# Patient Record
Sex: Female | Born: 1935 | Race: White | Hispanic: No | Smoking: Former smoker
Health system: Southern US, Community
[De-identification: ages and names within clinical notes are randomized; demographics above are authoritative.]

## PROBLEM LIST (undated history)

## (undated) DIAGNOSIS — I10 Essential (primary) hypertension: Secondary | ICD-10-CM

## (undated) DIAGNOSIS — M1990 Unspecified osteoarthritis, unspecified site: Secondary | ICD-10-CM

## (undated) DIAGNOSIS — G4730 Sleep apnea, unspecified: Secondary | ICD-10-CM

## (undated) DIAGNOSIS — K219 Gastro-esophageal reflux disease without esophagitis: Secondary | ICD-10-CM

## (undated) DIAGNOSIS — E039 Hypothyroidism, unspecified: Secondary | ICD-10-CM

## (undated) DIAGNOSIS — F419 Anxiety disorder, unspecified: Secondary | ICD-10-CM

## (undated) HISTORY — DX: Sleep apnea, unspecified: G47.30

## (undated) HISTORY — DX: Gastro-esophageal reflux disease without esophagitis: K21.9

## (undated) HISTORY — DX: Essential (primary) hypertension: I10

## (undated) HISTORY — DX: Hypothyroidism, unspecified: E03.9

## (undated) HISTORY — DX: Anxiety disorder, unspecified: F41.9

## (undated) HISTORY — DX: Unspecified osteoarthritis, unspecified site: M19.90

---

## 2006-11-04 ENCOUNTER — Encounter: Admission: RE | Admit: 2006-11-04 | Discharge: 2006-11-04 | Payer: Self-pay | Admitting: Orthopedic Surgery

## 2006-11-21 ENCOUNTER — Encounter: Admission: RE | Admit: 2006-11-21 | Discharge: 2006-11-21 | Payer: Self-pay | Admitting: Orthopedic Surgery

## 2006-12-21 ENCOUNTER — Encounter: Admission: RE | Admit: 2006-12-21 | Discharge: 2006-12-21 | Payer: Self-pay | Admitting: Orthopedic Surgery

## 2007-01-16 ENCOUNTER — Encounter: Admission: RE | Admit: 2007-01-16 | Discharge: 2007-01-16 | Payer: Self-pay | Admitting: Specialist

## 2008-05-19 ENCOUNTER — Encounter: Admission: RE | Admit: 2008-05-19 | Discharge: 2008-05-19 | Payer: Self-pay | Admitting: Specialist

## 2008-07-15 ENCOUNTER — Inpatient Hospital Stay (HOSPITAL_COMMUNITY): Admission: RE | Admit: 2008-07-15 | Discharge: 2008-07-21 | Payer: Self-pay | Admitting: Specialist

## 2008-09-18 ENCOUNTER — Encounter: Payer: Self-pay | Admitting: Pulmonary Disease

## 2008-12-30 ENCOUNTER — Inpatient Hospital Stay (HOSPITAL_COMMUNITY): Admission: RE | Admit: 2008-12-30 | Discharge: 2009-01-04 | Payer: Self-pay | Admitting: Orthopedic Surgery

## 2009-03-12 ENCOUNTER — Encounter: Payer: Self-pay | Admitting: Pulmonary Disease

## 2009-04-07 ENCOUNTER — Encounter: Payer: Self-pay | Admitting: Pulmonary Disease

## 2009-04-15 ENCOUNTER — Encounter: Payer: Self-pay | Admitting: Pulmonary Disease

## 2009-05-06 DIAGNOSIS — E1149 Type 2 diabetes mellitus with other diabetic neurological complication: Secondary | ICD-10-CM

## 2009-05-06 DIAGNOSIS — M722 Plantar fascial fibromatosis: Secondary | ICD-10-CM

## 2009-05-06 DIAGNOSIS — I10 Essential (primary) hypertension: Secondary | ICD-10-CM | POA: Insufficient documentation

## 2009-05-06 DIAGNOSIS — E559 Vitamin D deficiency, unspecified: Secondary | ICD-10-CM | POA: Insufficient documentation

## 2009-05-06 DIAGNOSIS — M5430 Sciatica, unspecified side: Secondary | ICD-10-CM

## 2009-05-06 DIAGNOSIS — M7710 Lateral epicondylitis, unspecified elbow: Secondary | ICD-10-CM | POA: Insufficient documentation

## 2009-05-06 DIAGNOSIS — E039 Hypothyroidism, unspecified: Secondary | ICD-10-CM | POA: Insufficient documentation

## 2009-05-06 DIAGNOSIS — M1990 Unspecified osteoarthritis, unspecified site: Secondary | ICD-10-CM | POA: Insufficient documentation

## 2009-05-06 DIAGNOSIS — K219 Gastro-esophageal reflux disease without esophagitis: Secondary | ICD-10-CM

## 2009-05-06 DIAGNOSIS — E7800 Pure hypercholesterolemia, unspecified: Secondary | ICD-10-CM | POA: Insufficient documentation

## 2009-05-06 DIAGNOSIS — F509 Eating disorder, unspecified: Secondary | ICD-10-CM | POA: Insufficient documentation

## 2009-05-06 DIAGNOSIS — F17200 Nicotine dependence, unspecified, uncomplicated: Secondary | ICD-10-CM | POA: Insufficient documentation

## 2009-05-06 DIAGNOSIS — R32 Unspecified urinary incontinence: Secondary | ICD-10-CM | POA: Insufficient documentation

## 2009-05-07 ENCOUNTER — Ambulatory Visit: Payer: Self-pay | Admitting: Pulmonary Disease

## 2009-05-07 DIAGNOSIS — G4736 Sleep related hypoventilation in conditions classified elsewhere: Secondary | ICD-10-CM

## 2009-05-27 ENCOUNTER — Encounter: Payer: Self-pay | Admitting: Pulmonary Disease

## 2009-05-27 ENCOUNTER — Ambulatory Visit (HOSPITAL_BASED_OUTPATIENT_CLINIC_OR_DEPARTMENT_OTHER): Admission: RE | Admit: 2009-05-27 | Discharge: 2009-05-27 | Payer: Self-pay | Admitting: Pulmonary Disease

## 2009-06-12 ENCOUNTER — Encounter: Admission: RE | Admit: 2009-06-12 | Discharge: 2009-06-12 | Payer: Self-pay | Admitting: Specialist

## 2009-06-16 ENCOUNTER — Ambulatory Visit: Payer: Self-pay | Admitting: Pulmonary Disease

## 2009-06-17 ENCOUNTER — Telehealth (INDEPENDENT_AMBULATORY_CARE_PROVIDER_SITE_OTHER): Payer: Self-pay | Admitting: *Deleted

## 2009-06-19 ENCOUNTER — Ambulatory Visit: Payer: Self-pay | Admitting: Pulmonary Disease

## 2009-06-19 DIAGNOSIS — G4733 Obstructive sleep apnea (adult) (pediatric): Secondary | ICD-10-CM | POA: Insufficient documentation

## 2009-07-27 ENCOUNTER — Telehealth: Payer: Self-pay | Admitting: Pulmonary Disease

## 2009-07-27 ENCOUNTER — Encounter: Payer: Self-pay | Admitting: Pulmonary Disease

## 2009-07-31 ENCOUNTER — Ambulatory Visit: Payer: Self-pay | Admitting: Pulmonary Disease

## 2009-09-10 ENCOUNTER — Encounter: Payer: Self-pay | Admitting: Pulmonary Disease

## 2009-10-16 ENCOUNTER — Inpatient Hospital Stay (HOSPITAL_COMMUNITY): Admission: RE | Admit: 2009-10-16 | Discharge: 2009-10-21 | Payer: Self-pay | Admitting: Orthopedic Surgery

## 2009-10-17 ENCOUNTER — Ambulatory Visit: Payer: Self-pay | Admitting: Infectious Diseases

## 2009-11-24 ENCOUNTER — Encounter: Payer: Self-pay | Admitting: Pulmonary Disease

## 2009-11-25 ENCOUNTER — Encounter: Payer: Self-pay | Admitting: Infectious Diseases

## 2009-11-26 ENCOUNTER — Ambulatory Visit: Payer: Self-pay | Admitting: Infectious Diseases

## 2009-11-26 DIAGNOSIS — L270 Generalized skin eruption due to drugs and medicaments taken internally: Secondary | ICD-10-CM | POA: Insufficient documentation

## 2009-11-26 DIAGNOSIS — T8489XA Other specified complication of internal orthopedic prosthetic devices, implants and grafts, initial encounter: Secondary | ICD-10-CM

## 2009-11-30 ENCOUNTER — Encounter: Payer: Self-pay | Admitting: Infectious Diseases

## 2009-12-01 ENCOUNTER — Encounter: Payer: Self-pay | Admitting: Infectious Diseases

## 2009-12-07 ENCOUNTER — Telehealth: Payer: Self-pay | Admitting: Pulmonary Disease

## 2009-12-22 ENCOUNTER — Encounter (INDEPENDENT_AMBULATORY_CARE_PROVIDER_SITE_OTHER): Payer: Self-pay | Admitting: Orthopedic Surgery

## 2009-12-22 ENCOUNTER — Inpatient Hospital Stay (HOSPITAL_COMMUNITY): Admission: RE | Admit: 2009-12-22 | Discharge: 2009-12-25 | Payer: Self-pay | Admitting: Orthopedic Surgery

## 2010-09-21 NOTE — Letter (Signed)
Summary: CMN for CPAP Supplies/[facility]  CMN for CPAP Supplies/[facility]   Imported By: Sherian Rein 09/17/2009 09:08:17  _____________________________________________________________________  External Attachment:    Type:   Image     Comment:   External Document  Appended Document: Orders Update    Clinical Lists Changes  Orders: Added new Referral order of DME Referral (DME) - Signed

## 2010-09-21 NOTE — Letter (Signed)
Summary: External Correspondence  External Correspondence   Imported By: Valinda Hoar 11/24/2009 08:34:21  _____________________________________________________________________  External Attachment:    Type:   Image     Comment:   External Document

## 2010-09-21 NOTE — Progress Notes (Signed)
Summary: pt turned cpap back to dme  Phone Note Outgoing Call   Call placed by: Alfonso Ramus Call placed to: Patient Summary of Call: [phone] Called pt to advise that CPAP Pressure would need to be increased to pressure of 12 cm per download.  Had left several messages and pt hadn't returned my call. Pt stated that she had turned cpap back in to [facility].  Pt stated that she couldn't wear cpap. Called [facility] and spoke with Tresa Endo who stated that pt did turn her cpap back in on 10/20/09. Appt for 01/28/10 cancelled per pt request. Initial call taken by: Alfonso Ramus,  December 07, 2009 3:35 PM  Follow-up for Phone Call        please call pt and remind her that she has severe sleep apnea with a BIG drop in her blood oxygen levels.  This is a big health risk for her.  She really needs ov to discuss this and other possible treatment options. Follow-up by: Barbaraann Share MD,  December 09, 2009 7:19 PM  Additional Follow-up for Phone Call Additional follow up Details #1::        Pt states she does not want to use CPAP at this time, and wants our office to leave her alone. She states she cannot sleep with the CPAP so that is why she turned it in. I advised there were many options as far as masks and things, she states she just wants to leave it alone for now and wants [place] to do the same. She refuses appt. Carron Curie CMA  December 10, 2009 9:07 AM     Additional Follow-up for Phone Call Additional follow up Details #2::    called pt and informed her of her critical oxygen desaturation at night, that potentially could be life threatening.  I have asked her to consider oxygen therapy at night since she doesn't want to wear cpap.  she states "I don't have time for this right now".  I have asked her to call us if she decides to try oxygen. Follow-up by: Barbaraann Share MD,  December 14, 2009 5:31 PM

## 2010-09-21 NOTE — Miscellaneous (Signed)
Summary: HIPAA Restrictions  HIPAA Restrictions   Imported By: Florinda Marker 11/30/2009 15:04:49  _____________________________________________________________________  External Attachment:    Type:   Image     Comment:   External Document

## 2010-09-21 NOTE — Assessment & Plan Note (Signed)
Summary: [facility] NEED CHART PROS KNEE INF/KAM   Referring Provider:  Arlan Organ Primary Provider:  Arlan Organ  CC:  [facility].  History of Present Illness:  This is a very pleasant 76 year old white   female with a history of morbid obesity, hypertension, diabetes,   hypothyroidism and asthma who underwent right total knee arthroplasty by   Dr. August Saucer in May of 2010 for severe end-stage osteoarthritis.  She says   the incision healed up well and she did great until the last few weeks,   when she started having some mild chills.  She did not have knee pain   until about 1 week ago, when she developed severe chills, diffuse   weakness, and developed severe knee pain where she could hardly walk.   She presented to Dr. Diamantina Providence office and had knee aspiration done on   October 14, 2009.  Cx grew coag-negative staph.   She  was admitted and had a washout and actually right knee extraction and   placement of antibiotic spacer with vancomycin and tobramycin on   October 16, 2009.  She is now postoperative and we are consulted for   further antibiotic management.   esr 109 crp 13 d/ced march 2nd on vanco.   Has been at [facility] and doing ok except broke out with whoel body rash 3 days ago.   Saw Dr August Saucer a few days ago.     Preventive Screening-Counseling & Management  Alcohol-Tobacco     Alcohol drinks/day: 0     Smoking Status: quit  Caffeine-Diet-Exercise     Caffeine use/day: coffee     Type of exercise: Rehab     Exercise (avg: min/session): 30-60     Times/week: 5  Safety-Violence-Falls     Seat Belt Use: yes   Prior Medication List:  SYNTHROID 125 MCG TABS (LEVOTHYROXINE SODIUM) Take 1 tablet by mouth once a day TOPROL XL 50 MG XR24H-TAB (METOPROLOL SUCCINATE) 1/2 tab by mouth two times a day LASIX 20 MG TABS (FUROSEMIDE) Take 1 tablet by mouth once a day GLUCOPHAGE XR 500 MG XR24H-TAB (METFORMIN HCL) take 2 tabs by mouth two times a day AMARYL 2 MG TABS (GLIMEPIRIDE) Take 1  tablet by mouth once a day LANTUS 100 UNIT/ML SOLN (INSULIN GLARGINE) take as directed VASOTEC 10 MG TABS (ENALAPRIL MALEATE) Take 1 tablet by mouth once a day FISH OIL 1000 MG CAPS (OMEGA-3 FATTY ACIDS) Take 1 tablet by mouth two times a day ZOCOR 80 MG TABS (SIMVASTATIN) Take 1 tablet by mouth once a day CITRACAL/VITAMIN D 250-200 MG-UNIT TABS (CALCIUM CITRATE-VITAMIN D) Take 1 tablet by mouth two times a day LYRICA 100 MG CAPS (PREGABALIN) Take 1 tablet by mouth three times a day * POTASSIUM Take 1 tablet by mouth two times a day VITAMIN D3 1000 UNIT TABS (CHOLECALCIFEROL) Take 1 tablet by mouth two times a day   Current Allergies (reviewed today): ! * GABAPENTIN Past History:  Past Surgical History: Last updated: 05/06/2009 carpal tunnel release 2003 lumbar surgery 07-15-2008 R total knee 12-30-2008  Family History: Last updated: 2009/06/04 father:  Deceased at age 6.  MI, DM, HTN mother: Deceased at age 31.  HTN, CAD, DM Siblings: three.  sister had aneurysm in neck.  asthma children: four.  Leukemia, DM,  son-suicide   emphysema: father allergies: brother heart disease: mother, father rheumatism: mother   Social History: Last updated: 11/26/2009 Patient states former smoker.  started at age 31.  2 1/2 to 51  ppd.  quit 1985 pt is widowed. pt has children. pt is a retired Surveyor, mining. The patient lives alone in [place].  Her son does live   right behind her and she has family involved in her care.  Her sister   was helping to care for her at home due to her immobility issues but her   sister has been sick herself.  The patient is trying to apply for home   health aides at home.   Risk Factors: Alcohol Use: 0 (11/26/2009) Caffeine Use: coffee (11/26/2009)  Risk Factors: Smoking Status: quit (11/26/2009)  Past Medical History: Current Problems:  SCIATICA (ICD-724.3) FASCIITIS, PLANTAR (ICD-728.71) LATERAL EPICONDYLITIS (ICD-726.32) TOBACCO ABUSE  (ICD-305.1) URINARY INCONTINENCE (ICD-788.30) BODY MASS INDEX 40 AND OVER ADULT (ICD-V85.4) EATING DISORDER (ICD-307.50) GERD (ICD-530.81) OSTEOARTHRITIS (ICD-715.90) HYPERCHOLESTEROLEMIA (ICD-272.0) HYPERTENSION (ICD-401.9) VITAMIN D DEFICIENCY (ICD-268.9) DIABETES MELLITUS, TYPE II, WITH NEUROLOGICAL COMPLICATIONS (ICD-250.60) HYPOTHYROIDISM (ICD-244.9)  1. Total knee arthroplasty as above.   2. Prior lumbar surgery.   3. Hypertension.   4. Hypothyroidism.   5. Diabetes.   6. Morbid obesity.   7. Asthma.     Social History: Patient states former smoker.  started at age 11.  2 1/2 to 3 ppd.  quit 1985 pt is widowed. pt has children. pt is a retired Surveyor, mining. The patient lives alone in [place].  Her son does live   right behind her and she has family involved in her care.  Her sister   was helping to care for her at home due to her immobility issues but her   sister has been sick herself.  The patient is trying to apply for home   health aides at home.   Review of Systems       11 systems reviewed and negative except per HPI   Vital Signs:  Patient profile:   76 year old female Height:      66 inches (167.64 cm) Weight:      269.0 pounds (122.27 kg) BMI:     43.57 Temp:     98.1 degrees F (36.72 degrees C) oral Pulse rate:   109 / minute BP sitting:   141 / 79  (left arm)  Vitals Entered By: Baxter Hire) (November 26, 2009 10:49 AM) CC: [facility] Is Patient Diabetic? Yes Pain Assessment Patient in pain? no      Nutritional Status BMI of > 30 = obese Nutritional Status Detail APPETITE IS GOOD PER PATIENT  Does patient need assistance? Functional Status Self care Ambulation Normal Comments patient uses a walker as well as a wheelchair   Physical Exam  General:  alert, well-developed, and well-nourished.   Head:  normocephalic.   Mouth:  fair dentition.   Neck:  supple.   Lungs:  normal respiratory effort and no accessory muscle use.     Heart:  normal rate and regular rhythm.   Msk:  R knee incison well healed.  Some edema and induration around knee  Extremities:  1+ edema bil Neurologic:  alert & oriented X3 and cranial nerves II-XII intact.   Skin:  picc rue diffuse macular rash  Cervical Nodes:  no anterior cervical adenopathy and no posterior cervical adenopathy.   Psych:  Oriented X3 and memory intact for recent and remote.   Additional Exam:  BW 4/6 wbc 5 hgb 11 plt 172 eos .3 esr 26 crp 9.6 high sensitiviey    FEW  STAPHYLOCOCCUS SPECIES(COAGULASE NEGATIVE)  METHOD:                       MIC  CLINDAMYCIN:                  <=0.25                                SENSITIVE  ERYTHROMYCIN:                 <=0.25                                SENSITIVE  GENTAMICIN:                   <=0.5                                SENSITIVE  LEVOFLOXACIN:                 <=0.12                                SENSITIVE  OXACILLIN:                    1                                RESISTANT  PENICILLIN:                   >=0.5                                RESISTANT  RIFAMPIN:                     <=0.5                                SENSITIVE  VANCOMYCIN:                   <=0.5                                SENSITIVE  TETRACYCLINE:                 <=1                                SENSITIVE   Impression & Recommendations:  Problem # 1:  OTH COMPLICATIONS DUE INTERNAL JOINT PROSTHESIS (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  Develoepd rash a few days ago. Knee well healed.  Will stop vanco, pull picc. Start doxy 100 by mouth two times a day for 2 more weeks and I have left message with Dr August Saucer re whether or not he will reimplant and wants her off abx prior to surgery  Orders: Est. Patient Level IV (16109)  Problem # 2:  DERMATITIS DUE DRUGS&MEDICINES  TAKEN INTERNALLY (ICD-693.0)  stop vanco. BW from yest with nml wbc and no eosinophils.   Zyrtec for itching  Orders: Est. Patient Level IV (16109)  Patient Instructions: 1)  Follow up in 3 weeks.   2)  Stop vancomycin and pull picc line. 3)  Start doxycycline 100 mg by mouth two times a day until seen by Dr August Saucer.  Prevention & Chronic Care Immunizations   Influenza vaccine: Fluvax 3+  (06/24/2008)    Tetanus booster: 03/18/2008: Tdap    Pneumococcal vaccine: Pneumovax  (06/23/2007)    H. zoster vaccine: Not documented  Colorectal Screening   Hemoccult: Not documented    Colonoscopy: Not documented  Other Screening   Pap smear: Not documented    Mammogram: Not documented    DXA bone density scan: Not documented   Smoking status: quit  (11/26/2009)  Diabetes Mellitus   HgbA1C: Not documented    Eye exam: Not documented    Foot exam: Not documented   High risk foot: Not documented   Foot care education: Not documented    Urine microalbumin/creatinine ratio: Not documented  Lipids   Total Cholesterol: Not documented   LDL: Not documented   LDL Direct: Not documented   HDL: Not documented   Triglycerides: Not documented    SGOT (AST): Not documented   SGPT (ALT): Not documented   Alkaline phosphatase: Not documented   Total bilirubin: Not documented  Hypertension   Last Blood Pressure: 141 / 79  (11/26/2009)   Serum creatinine: Not documented   Serum potassium Not documented  Self-Management Support :    Diabetes self-management support: Not documented    Hypertension self-management support: Not documented    Lipid self-management support: Not documented     Appended Document: Spoke to Dr August Saucer    Phone Note Other Incoming   Summary of Call: Spoke to Dr August Saucer and he would prefer her to be off abx so he can assess in 2 weeks whether or not to replace new based on her numbers.  I have called [facility] and told them to d/c the doxy I just ordered.      Initial call taken by: Clydie Braun MD,  November 26, 2009 2:10 PM

## 2010-09-21 NOTE — Consult Note (Signed)
Summary: Consultation Report  Consultation Report   Imported By: Florinda Marker 12/03/2009 09:47:13  _____________________________________________________________________  External Attachment:    Type:   Image     Comment:   External Document

## 2010-09-21 NOTE — Miscellaneous (Signed)
Summary: [facility]: RX  [facility]: RX   Imported By: Florinda Marker 12/01/2009 16:11:13  _____________________________________________________________________  External Attachment:    Type:   Image     Comment:   External Document

## 2010-10-20 IMAGING — CR DG KNEE 1-2V PORT*R*
2 series · 2 of 2 positions shown · non-contrast
Comparison: 10/16/2009

CLINICAL DATA: Right knee replacement.  Infection.

PORTABLE RIGHT KNEE - 1-2 VIEW

[view not recorded (1 of 2)]
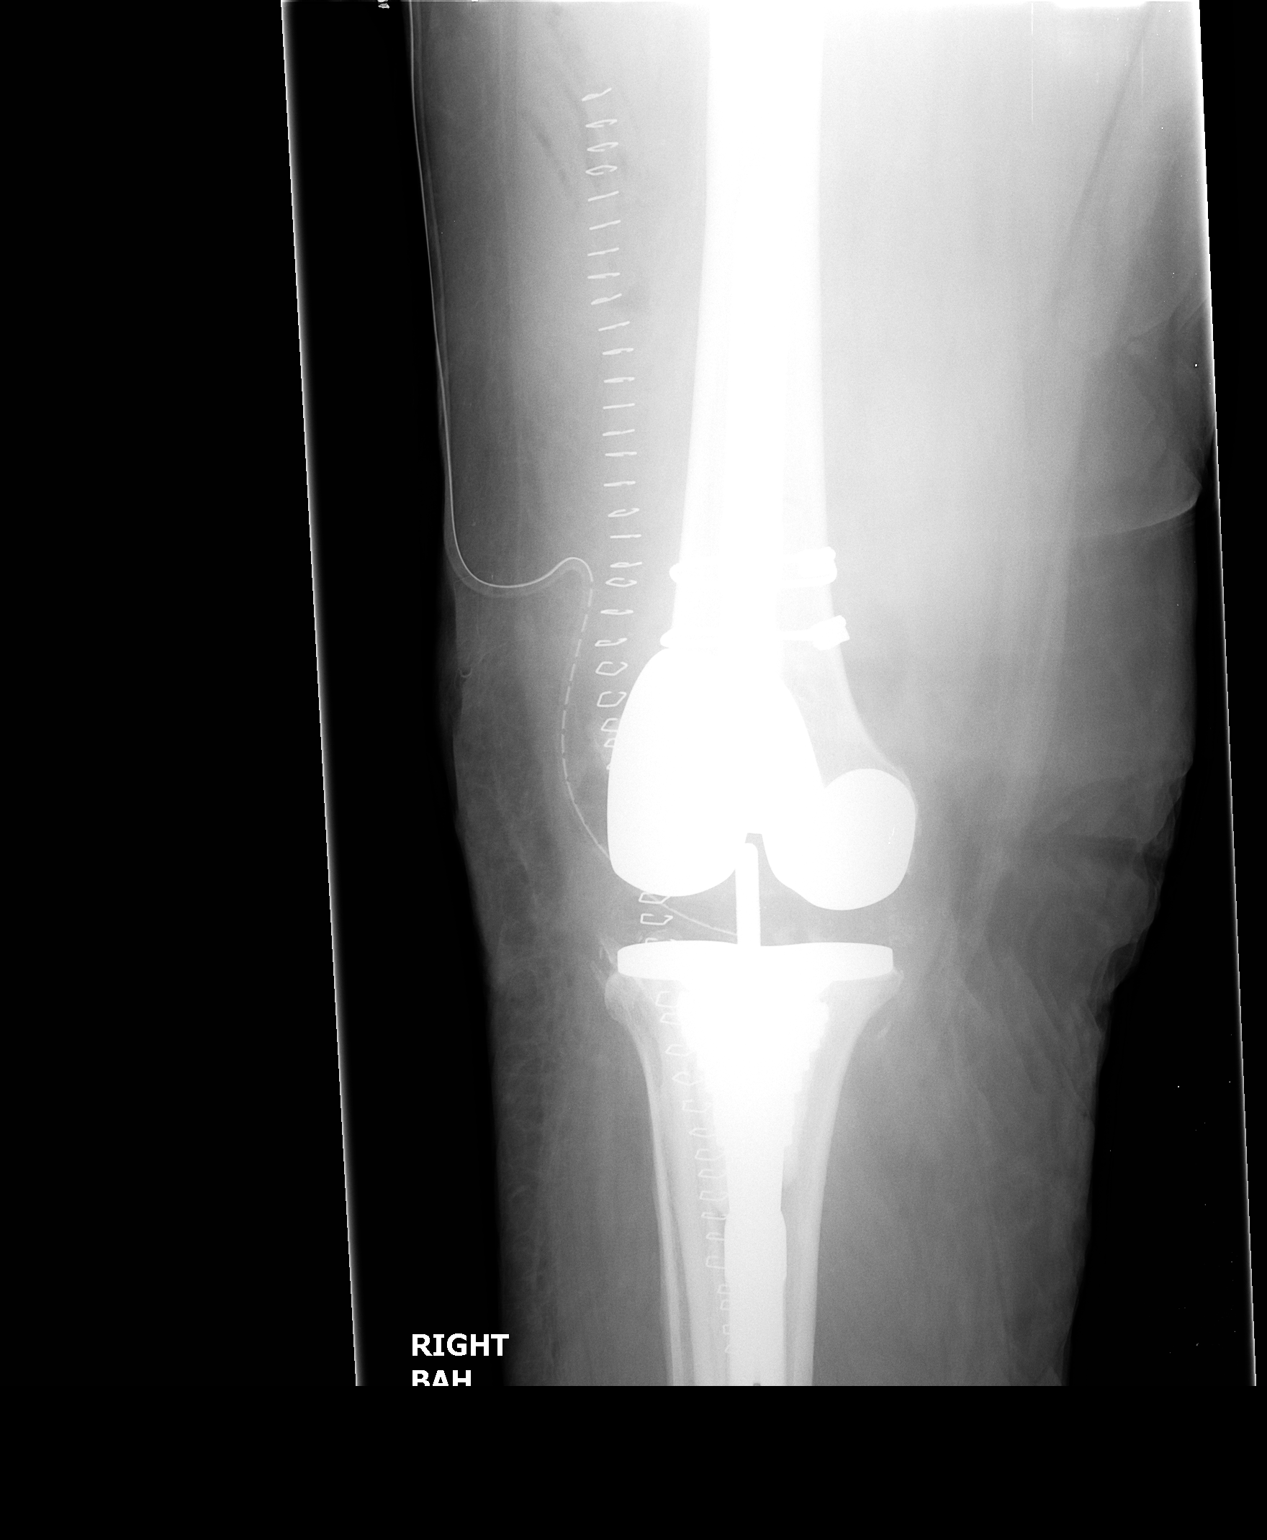

[view not recorded (2 of 2)]
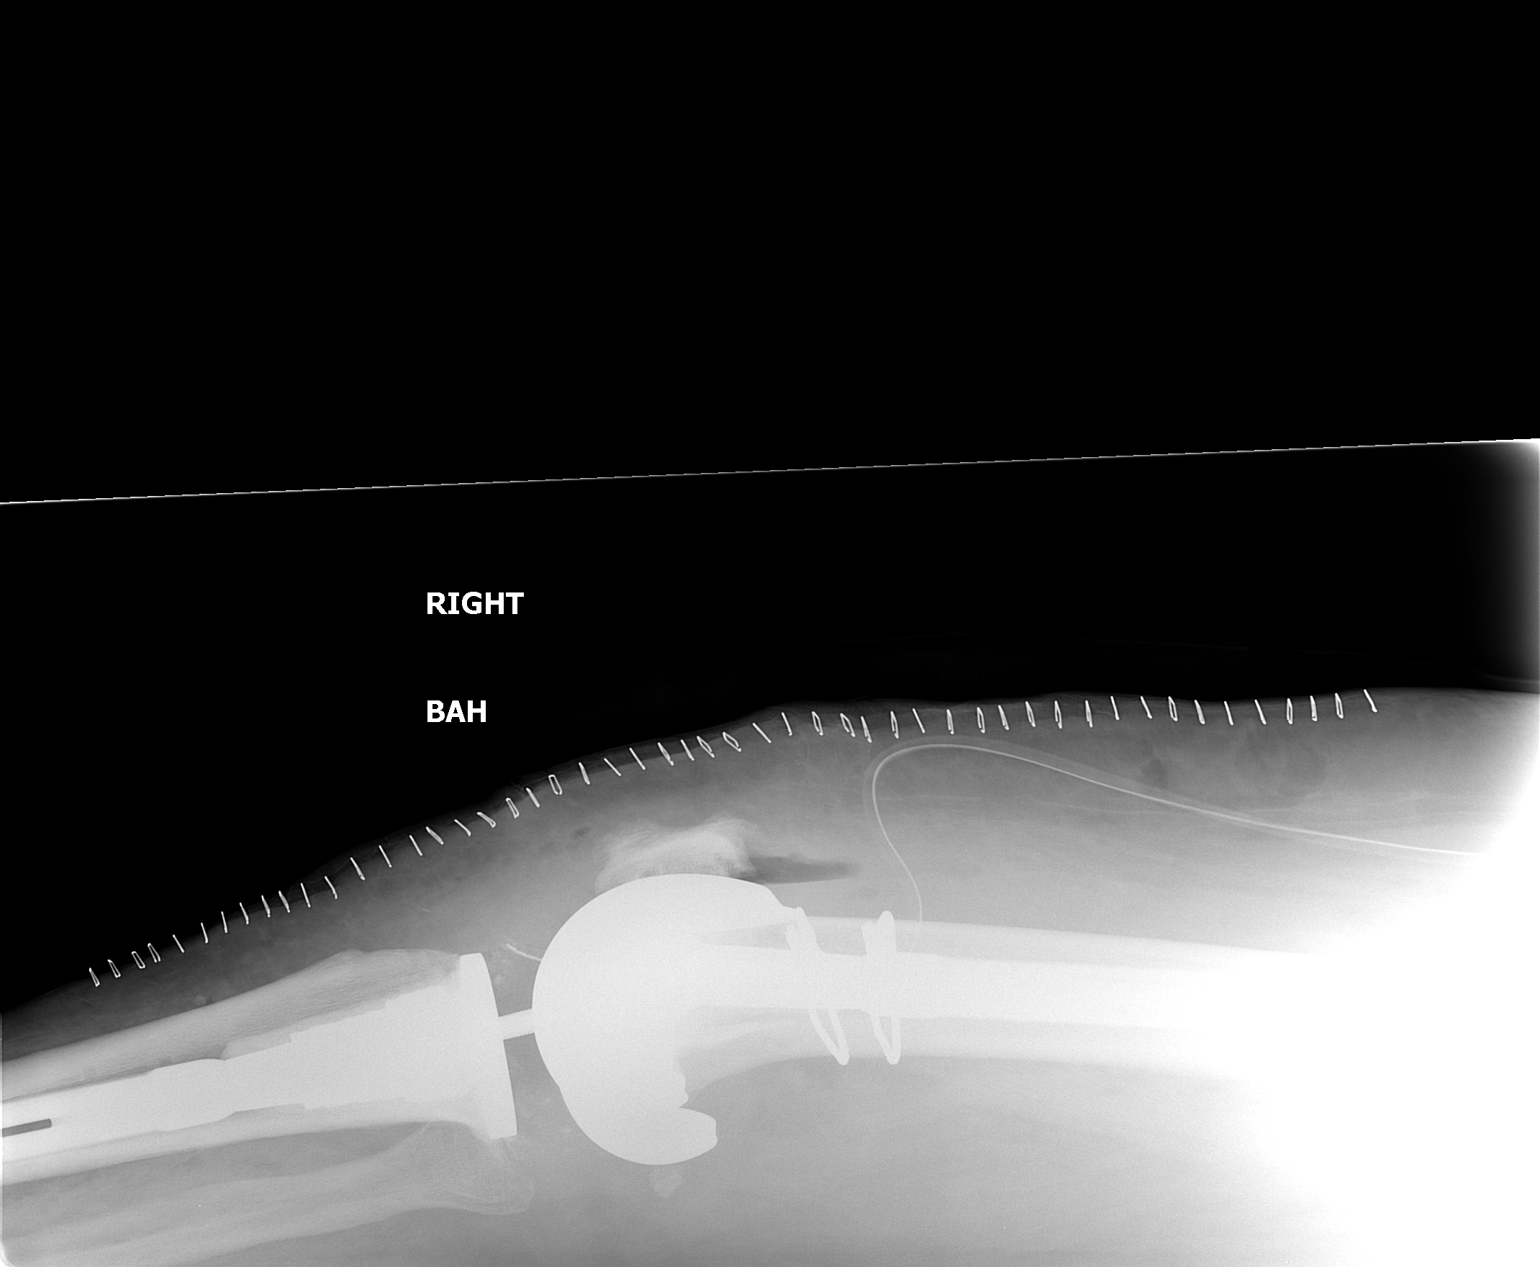

[2 of 2 positions shown; findings below may reference images not displayed]

FINDINGS: Right knee replacement has been performed.  There is a
long metal stent on the femoral and the tibial prosthesis.  There
are two cerclage wires around the distal femur.  There is a drain
in the joint with gas and fluid in the knee joint.

The alignment is satisfactory and there is no fracture.
IMPRESSION: No acute complications following total knee replacement.

## 2010-10-20 IMAGING — CR DG KNEE 1-2V PORT*R*
2 series · 2 of 2 positions shown · non-contrast
Comparison: 10/16/2009

CLINICAL DATA: Right knee replacement.  Infection.

PORTABLE RIGHT KNEE - 1-2 VIEW

[view not recorded (1 of 2)]
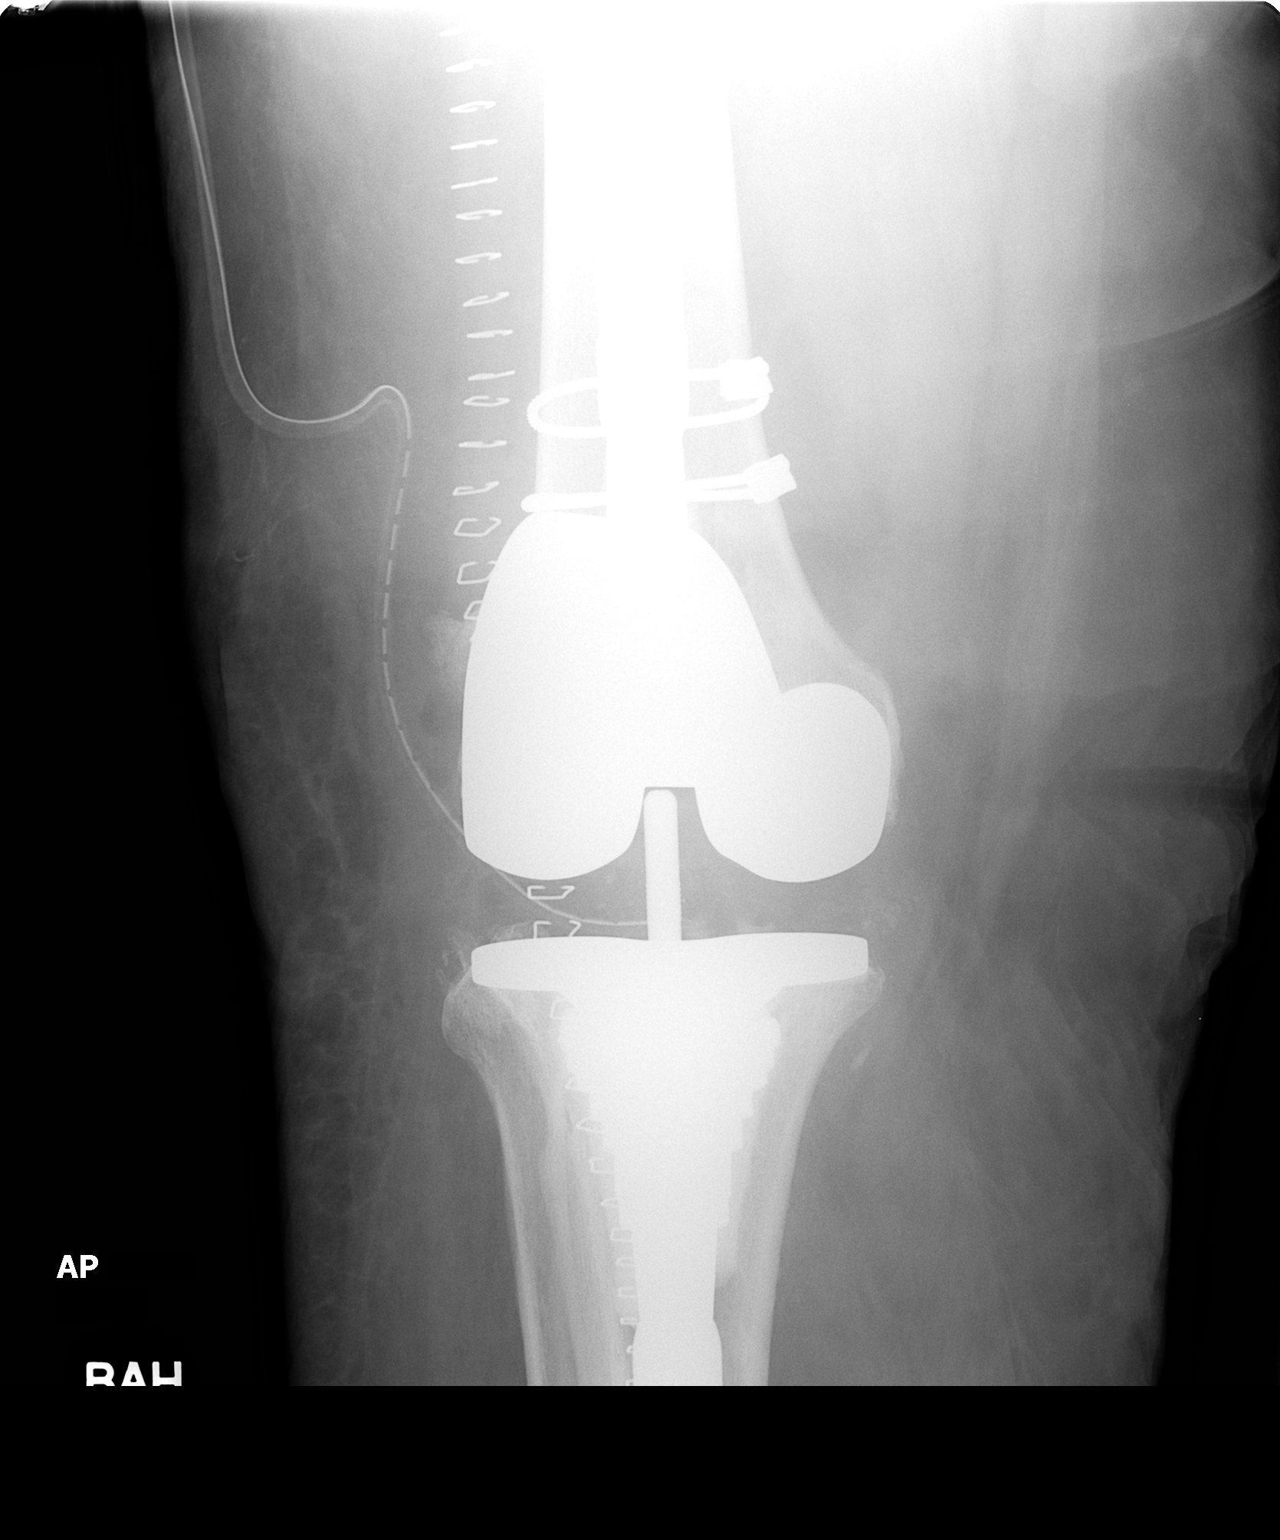

[view not recorded (2 of 2)]
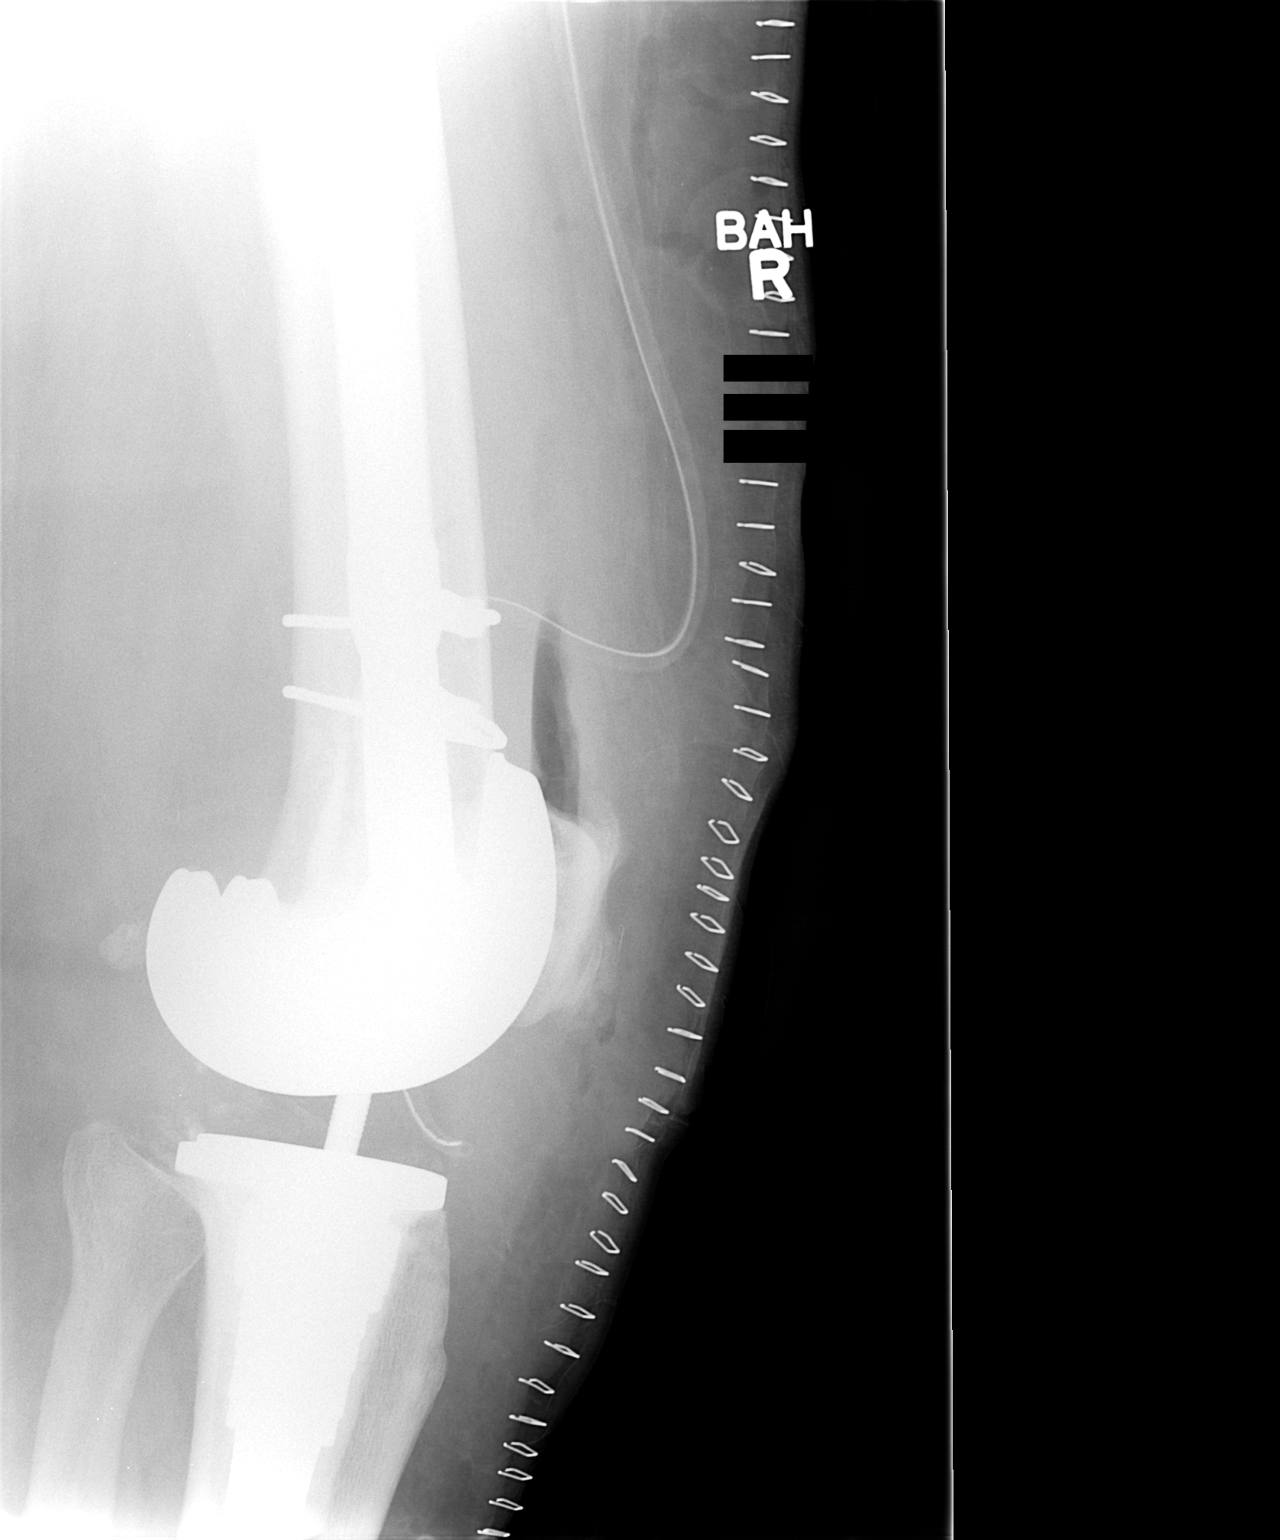

[2 of 2 positions shown; findings below may reference images not displayed]

FINDINGS: Right knee replacement has been performed.  There is a
long metal stent on the femoral and the tibial prosthesis.  There
are two cerclage wires around the distal femur.  There is a drain
in the joint with gas and fluid in the knee joint.

The alignment is satisfactory and there is no fracture.
IMPRESSION: No acute complications following total knee replacement.

## 2010-11-09 LAB — BASIC METABOLIC PANEL
BUN: 18 mg/dL (ref 6–23)
BUN: 20 mg/dL (ref 6–23)
CO2: 28 mEq/L (ref 19–32)
CO2: 28 mEq/L (ref 19–32)
Chloride: 107 mEq/L (ref 96–112)
Chloride: 107 mEq/L (ref 96–112)
Creatinine, Ser: 0.87 mg/dL (ref 0.4–1.2)
GFR calc Af Amer: 58 mL/min — ABNORMAL LOW (ref >60)
GFR calc non Af Amer: >60 mL/min (ref >60)
GFR calc non Af Amer: 56 mL/min — ABNORMAL LOW (ref >60)
Glucose, Bld: 106 mg/dL — ABNORMAL HIGH (ref 70–99)
Glucose, Bld: 136 mg/dL — ABNORMAL HIGH (ref 70–99)
Glucose, Bld: 99 mg/dL (ref 70–99)
Potassium: 3.8 mEq/L (ref 3.5–5.1)
Potassium: 3.9 mEq/L (ref 3.5–5.1)
Potassium: 3.8 mEq/L (ref 3.5–5.1)
Sodium: 140 mEq/L (ref 135–145)
Sodium: 141 mEq/L (ref 135–145)

## 2010-11-09 LAB — CBC
HCT: 21.1 % — ABNORMAL LOW (ref 36.0–46.0)
HCT: 22.2 % — ABNORMAL LOW (ref 36.0–46.0)
HCT: 33 % — ABNORMAL LOW (ref 36.0–46.0)
Hemoglobin: 7.3 g/dL — ABNORMAL LOW (ref 12.0–15.0)
Hemoglobin: 11.7 g/dL — ABNORMAL LOW (ref 12.0–15.0)
MCHC: 34.6 g/dL (ref 30.0–36.0)
MCHC: 34.7 g/dL (ref 30.0–36.0)
MCHC: 35.4 g/dL (ref 30.0–36.0)
MCV: 86.7 fL (ref 78.0–100.0)
MCV: 86.7 fL (ref 78.0–100.0)
MCV: 86.7 fL (ref 78.0–100.0)
Platelets: 114 10*3/uL — ABNORMAL LOW (ref 150–400)
RBC: 2.43 MIL/uL — ABNORMAL LOW (ref 3.87–5.11)
RDW: 15.9 % — ABNORMAL HIGH (ref 11.5–15.5)
RDW: 16.3 % — ABNORMAL HIGH (ref 11.5–15.5)
RDW: 16.5 % — ABNORMAL HIGH (ref 11.5–15.5)
RDW: 17 % — ABNORMAL HIGH (ref 11.5–15.5)
WBC: 4.4 10*3/uL (ref 4.0–10.5)

## 2010-11-09 LAB — GLUCOSE, CAPILLARY
Glucose-Capillary: 100 mg/dL — ABNORMAL HIGH (ref 70–99)
Glucose-Capillary: 106 mg/dL — ABNORMAL HIGH (ref 70–99)
Glucose-Capillary: 110 mg/dL — ABNORMAL HIGH (ref 70–99)
Glucose-Capillary: 117 mg/dL — ABNORMAL HIGH (ref 70–99)
Glucose-Capillary: 124 mg/dL — ABNORMAL HIGH (ref 70–99)
Glucose-Capillary: 127 mg/dL — ABNORMAL HIGH (ref 70–99)
Glucose-Capillary: 149 mg/dL — ABNORMAL HIGH (ref 70–99)
Glucose-Capillary: 169 mg/dL — ABNORMAL HIGH (ref 70–99)
Glucose-Capillary: 96 mg/dL (ref 70–99)

## 2010-11-09 LAB — ANAEROBIC CULTURE: Gram Stain: NONE SEEN

## 2010-11-09 LAB — URINE CULTURE: Colony Count: 75000

## 2010-11-09 LAB — DIFFERENTIAL
Basophils Absolute: 0 10*3/uL (ref 0.0–0.1)
Eosinophils Absolute: 0.2 10*3/uL (ref 0.0–0.7)
Eosinophils Relative: 4 % (ref 0–5)
Neutrophils Relative %: 69 % (ref 43–77)

## 2010-11-09 LAB — BODY FLUID CULTURE: Culture: NO GROWTH

## 2010-11-09 LAB — TYPE AND SCREEN
ABO/RH(D): O POS
Antibody Screen: NEGATIVE

## 2010-11-09 LAB — URINALYSIS, ROUTINE W REFLEX MICROSCOPIC
Bilirubin Urine: NEGATIVE
Glucose, UA: NEGATIVE mg/dL
Hgb urine dipstick: NEGATIVE
Ketones, ur: NEGATIVE mg/dL
pH: 5 (ref 5.0–8.0)

## 2010-11-09 LAB — PROTIME-INR
INR: 1.24 (ref 0.00–1.49)
INR: 1.24 (ref 0.00–1.49)
Prothrombin Time: 15.5 seconds — ABNORMAL HIGH (ref 11.6–15.2)
Prothrombin Time: 16.8 seconds — ABNORMAL HIGH (ref 11.6–15.2)
Prothrombin Time: 13.7 seconds (ref 11.6–15.2)

## 2010-11-09 LAB — SEDIMENTATION RATE: Sed Rate: 25 mm/hr — ABNORMAL HIGH (ref 0–22)

## 2010-11-10 LAB — GLUCOSE, CAPILLARY
Glucose-Capillary: 104 mg/dL — ABNORMAL HIGH (ref 70–99)
Glucose-Capillary: 105 mg/dL — ABNORMAL HIGH (ref 70–99)
Glucose-Capillary: 108 mg/dL — ABNORMAL HIGH (ref 70–99)
Glucose-Capillary: 109 mg/dL — ABNORMAL HIGH (ref 70–99)
Glucose-Capillary: 109 mg/dL — ABNORMAL HIGH (ref 70–99)
Glucose-Capillary: 112 mg/dL — ABNORMAL HIGH (ref 70–99)
Glucose-Capillary: 137 mg/dL — ABNORMAL HIGH (ref 70–99)
Glucose-Capillary: 174 mg/dL — ABNORMAL HIGH (ref 70–99)
Glucose-Capillary: 92 mg/dL (ref 70–99)
Glucose-Capillary: 92 mg/dL (ref 70–99)

## 2010-11-10 LAB — URINALYSIS, ROUTINE W REFLEX MICROSCOPIC
Bilirubin Urine: NEGATIVE
Hgb urine dipstick: NEGATIVE
Nitrite: NEGATIVE
Specific Gravity, Urine: 1.021 (ref 1.005–1.030)
pH: 6 (ref 5.0–8.0)

## 2010-11-10 LAB — CBC
HCT: 25.8 % — ABNORMAL LOW (ref 36.0–46.0)
Hemoglobin: 9 g/dL — ABNORMAL LOW (ref 12.0–15.0)
Hemoglobin: 9.7 g/dL — ABNORMAL LOW (ref 12.0–15.0)
MCHC: 35 g/dL (ref 30.0–36.0)
MCV: 88.3 fL (ref 78.0–100.0)
MCV: 88.3 fL (ref 78.0–100.0)
RBC: 2.92 MIL/uL — ABNORMAL LOW (ref 3.87–5.11)
RBC: 3.13 MIL/uL — ABNORMAL LOW (ref 3.87–5.11)
RBC: 3.57 MIL/uL — ABNORMAL LOW (ref 3.87–5.11)
WBC: 6.4 10*3/uL (ref 4.0–10.5)
WBC: 6.7 10*3/uL (ref 4.0–10.5)

## 2010-11-10 LAB — URINE CULTURE: Colony Count: 25000

## 2010-11-10 LAB — DIFFERENTIAL
Basophils Absolute: 0 10*3/uL (ref 0.0–0.1)
Basophils Absolute: 0 10*3/uL (ref 0.0–0.1)
Basophils Relative: 0 % (ref 0–1)
Eosinophils Absolute: 0.1 10*3/uL (ref 0.0–0.7)
Eosinophils Absolute: 0.1 10*3/uL (ref 0.0–0.7)
Eosinophils Relative: 1 % (ref 0–5)
Eosinophils Relative: 1 % (ref 0–5)
Lymphocytes Relative: 9 % — ABNORMAL LOW (ref 12–46)
Lymphs Abs: 1.5 10*3/uL (ref 0.7–4.0)
Monocytes Absolute: 0.8 10*3/uL (ref 0.1–1.0)
Monocytes Absolute: 1 10*3/uL (ref 0.1–1.0)
Monocytes Relative: 10 % (ref 3–12)
Monocytes Relative: 12 % (ref 3–12)
Neutro Abs: 5.3 10*3/uL (ref 1.7–7.7)

## 2010-11-10 LAB — BASIC METABOLIC PANEL
CO2: 29 mEq/L (ref 19–32)
Calcium: 8.5 mg/dL (ref 8.4–10.5)
Chloride: 100 mEq/L (ref 96–112)
Creatinine, Ser: 0.91 mg/dL (ref 0.4–1.2)
Creatinine, Ser: 0.94 mg/dL (ref 0.4–1.2)
GFR calc Af Amer: >60 mL/min (ref >60)
GFR calc Af Amer: >60 mL/min (ref >60)
GFR calc non Af Amer: >60 mL/min (ref >60)
Potassium: 4 mEq/L (ref 3.5–5.1)
Sodium: 142 mEq/L (ref 135–145)

## 2010-11-10 LAB — TYPE AND SCREEN

## 2010-11-10 LAB — COMPREHENSIVE METABOLIC PANEL
ALT: 21 U/L (ref 0–35)
AST: 16 U/L (ref 0–37)
CO2: 26 mEq/L (ref 19–32)
Chloride: 101 mEq/L (ref 96–112)
Creatinine, Ser: 0.95 mg/dL (ref 0.4–1.2)
GFR calc Af Amer: >60 mL/min (ref >60)
GFR calc non Af Amer: 58 mL/min — ABNORMAL LOW (ref >60)
Sodium: 137 mEq/L (ref 135–145)
Total Bilirubin: 1 mg/dL (ref 0.3–1.2)

## 2010-11-10 LAB — PROTIME-INR
INR: 1.16 (ref 0.00–1.49)
Prothrombin Time: 14.6 seconds (ref 11.6–15.2)
Prothrombin Time: 14.7 seconds (ref 11.6–15.2)

## 2010-11-10 LAB — ANAEROBIC CULTURE: Gram Stain: NONE SEEN

## 2010-11-10 LAB — TISSUE CULTURE: Gram Stain: NONE SEEN

## 2010-11-10 LAB — C-REACTIVE PROTEIN: CRP: 13.9 mg/dL — ABNORMAL HIGH (ref <0.6)

## 2010-11-10 LAB — BODY FLUID CULTURE

## 2010-11-15 LAB — GLUCOSE, CAPILLARY
Glucose-Capillary: 77 mg/dL (ref 70–99)
Glucose-Capillary: 78 mg/dL (ref 70–99)
Glucose-Capillary: 88 mg/dL (ref 70–99)
Glucose-Capillary: 96 mg/dL (ref 70–99)

## 2010-11-15 LAB — PROTIME-INR
INR: 1.4 (ref 0.00–1.49)
INR: 1.71 — ABNORMAL HIGH (ref 0.00–1.49)
Prothrombin Time: 19.9 seconds — ABNORMAL HIGH (ref 11.6–15.2)

## 2010-11-30 LAB — DIFFERENTIAL
Basophils Absolute: 0 10*3/uL (ref 0.0–0.1)
Lymphocytes Relative: 29 % (ref 12–46)
Lymphs Abs: 1.9 10*3/uL (ref 0.7–4.0)
Monocytes Absolute: 0.5 10*3/uL (ref 0.1–1.0)
Monocytes Relative: 8 % (ref 3–12)
Neutro Abs: 4 10*3/uL (ref 1.7–7.7)

## 2010-11-30 LAB — CBC
HCT: 24.6 % — ABNORMAL LOW (ref 36.0–46.0)
Hemoglobin: 12.7 g/dL (ref 12.0–15.0)
MCHC: 35.2 g/dL (ref 30.0–36.0)
MCV: 88.5 fL (ref 78.0–100.0)
Platelets: 138 10*3/uL — ABNORMAL LOW (ref 150–400)
Platelets: 145 10*3/uL — ABNORMAL LOW (ref 150–400)
Platelets: 164 10*3/uL (ref 150–400)
RBC: 4.09 MIL/uL (ref 3.87–5.11)
RDW: 14.6 % (ref 11.5–15.5)
RDW: 14.8 % (ref 11.5–15.5)
RDW: 14.9 % (ref 11.5–15.5)
WBC: 6.6 10*3/uL (ref 4.0–10.5)
WBC: 9.5 10*3/uL (ref 4.0–10.5)

## 2010-11-30 LAB — GLUCOSE, CAPILLARY
Glucose-Capillary: 102 mg/dL — ABNORMAL HIGH (ref 70–99)
Glucose-Capillary: 115 mg/dL — ABNORMAL HIGH (ref 70–99)
Glucose-Capillary: 140 mg/dL — ABNORMAL HIGH (ref 70–99)
Glucose-Capillary: 140 mg/dL — ABNORMAL HIGH (ref 70–99)
Glucose-Capillary: 143 mg/dL — ABNORMAL HIGH (ref 70–99)
Glucose-Capillary: 143 mg/dL — ABNORMAL HIGH (ref 70–99)
Glucose-Capillary: 161 mg/dL — ABNORMAL HIGH (ref 70–99)
Glucose-Capillary: 180 mg/dL — ABNORMAL HIGH (ref 70–99)
Glucose-Capillary: 185 mg/dL — ABNORMAL HIGH (ref 70–99)
Glucose-Capillary: 210 mg/dL — ABNORMAL HIGH (ref 70–99)
Glucose-Capillary: 89 mg/dL (ref 70–99)
Glucose-Capillary: 90 mg/dL (ref 70–99)
Glucose-Capillary: 94 mg/dL (ref 70–99)

## 2010-11-30 LAB — URINALYSIS, ROUTINE W REFLEX MICROSCOPIC
Glucose, UA: NEGATIVE mg/dL
Protein, ur: NEGATIVE mg/dL
Specific Gravity, Urine: 1.012 (ref 1.005–1.030)

## 2010-11-30 LAB — BASIC METABOLIC PANEL
BUN: 19 mg/dL (ref 6–23)
BUN: 20 mg/dL (ref 6–23)
CO2: 28 mEq/L (ref 19–32)
Calcium: 10.3 mg/dL (ref 8.4–10.5)
Chloride: 102 mEq/L (ref 96–112)
Creatinine, Ser: 0.99 mg/dL (ref 0.4–1.2)
Creatinine, Ser: 1.02 mg/dL (ref 0.4–1.2)
GFR calc Af Amer: >60 mL/min (ref >60)
GFR calc non Af Amer: 55 mL/min — ABNORMAL LOW (ref >60)
GFR calc non Af Amer: >60 mL/min (ref >60)
Glucose, Bld: 141 mg/dL — ABNORMAL HIGH (ref 70–99)
Potassium: 4.3 mEq/L (ref 3.5–5.1)
Potassium: 4.4 mEq/L (ref 3.5–5.1)
Sodium: 141 mEq/L (ref 135–145)

## 2010-11-30 LAB — PROTIME-INR
INR: 1.1 (ref 0.00–1.49)
Prothrombin Time: 14.2 seconds (ref 11.6–15.2)
Prothrombin Time: 19.4 seconds — ABNORMAL HIGH (ref 11.6–15.2)

## 2010-11-30 LAB — TYPE AND SCREEN

## 2010-11-30 LAB — URINE CULTURE

## 2011-01-04 NOTE — Op Note (Signed)
NAME:  Catherine [name], Catherine [name]              ACCOUNT NO.:  [id number]   MEDICAL RECORD NO.:  [id number]          PATIENT TYPE:  INP   LOCATION:  5037                         FACILITY:  [facility]   PHYSICIAN:  Kerrin Champagne, M.D.   DATE OF BIRTH:  1935/05/16   DATE OF PROCEDURE:  07/15/2008  DATE OF DISCHARGE:                               OPERATIVE REPORT   PREOPERATIVE DIAGNOSES:  Foraminal stenosis bilateral L5-S1 with mild  lateral recess stenosis at L4-5, isthmic spondylolisthesis L5-S1 with  bilateral pars defects and grade 1 slip.   POSTOPERATIVE DIAGNOSES:  Foraminal stenosis bilateral L5-S1 with mild  lateral recess stenosis at L4-5, isthmic spondylolisthesis L5-S1 with  bilateral pars defects and grade 1 slip.   PROCEDURE:  Gill procedure L5-S1 with right transforaminal lumbar  interbody fusion with local bone graft and 11-mm lordotic DePuy Concorde  cage.  Posterolateral fusion L5-S1 with local bone graft.  Instrumentation posteriorly using DePuy Monarch pedicle screws and rods  L5-S1.  Central laminectomy L4-5 with bilateral lateral recess  decompression right-sided disk incision and exploration for HNP.   SURGEON:  Kerrin Champagne, MD   ASSISTANT:  Wende Neighbors, PA-C.   ANESTHESIA:  General via orotracheal intubation, Dr. Gypsy Balsam, Dr.  Gelene Mink performed a IJ line placed into the neck.   FINDINGS:  Bilateral foraminal stenosis L5-S1 affecting primarily the L5  nerve root, grade 1 spondylolisthesis with bilateral pars defects at the  L5 level.  Degenerative disk disease L4-5 with bilateral lateral recess  stenosis.   ESTIMATED BLOOD LOSS:  1300 mL.  Cell Saver returned 350 mL with hyper  hematocrit.   COMPLICATIONS:  None.   DRAINS:  Foley to straight drain right lower lumbar Hemovac x1.   BRIEF CLINICAL HISTORY:  The patient is a 76 year old female who has had  a history of chronic low back pain.  Over the last year and a half, this  has worsened to include increasing  pain, neurogenic claudication,  radiation to the right lower extremity.  She has undergone attempts of  conservative management, use of antiinflammatory agents, oral narcotic  medicines.  The patient has failed conservative management at this  point, desires to undergo intervention to diminish leg pain, improve her  standing and walking capacity.  She has had preoperative evaluation by  her family practitioner Dr. Cliffton Asters of [place].  He was brought to the  operating room to undergo decompressive procedure of the L4-5 and L5-S1.   INTRAOPERATIVE FINDINGS:  As above.   DESCRIPTION OF PROCEDURE:  After adequate general anesthesia, the  patient in a prone position.  Chest rolls were used.  Legs well-padded.  Arms at the sides 90-90 at the shoulder, elbow.  Standard prep with  DuraPrep solution, draped in the usual manner.  Standard preoperative  antibiotics of Ancef.  Neuromonitoring equipment was placed.  The  patient had a IJ line placed to the preoperative area by Dr. Gelene Mink.  TED hose as well as PAS stocking to prevent DVT intraoperatively.   Postoperatively, the patient will be maintained on PAS stockings.   The patient's incision was made at  the expected L2-3 level extended  inferiorly to about S2.  In the midline through the skin and  subcutaneous layers after infiltration with Marcaine 0.5% with 1:200,000  epinephrine.  This was carried down to the lumbodorsal fascia, incised  over the spinous processes of S2, S3 and then down to L5, L4, L3, and  L2.  Incision carried along the sides of the spinous process of L3, L4,  L5, S1, and S2 down to the posterior lamina and electrocautery used to  careful divide the paralumbar muscles off the bony attachments here.  Initially, cerebellar retractors and then Deaver retractors and Viper  retractor was used.   Intraoperative radiograph obtained with clamps on the spinous process of  L5 and L4.  These were then marked and incised with  electrocautery to  identify for remainder of the case.   Exposure obtained laterally up to the tips of the transverse process of  L5, both sides of the L4-5 level preserving the facet capsule here.  And  out over the sacral ala both sides at the S1 level continued lateral to  the L5 and S1 facet on both sides.  Electrocautery and bipolar  electrocautery used to control bleeders here.   The patient then had Leksell rongeur to remove the interspinous  ligaments between L4-5 and L5-S1.  Small amount of superior aspect of  the spinous process of the S1 were resected as well as inferior 50% of  the L4 spinous process.  Below the lateral gutters were then packed at  both sides, makes it difficulty, and then sponges were packed in the  both lateral gutters.  A 4-mm Kerrison used to resect the ligamentum  flavum at the L4-5 level of the superior aspect of the lamina of L5, of  the medial aspect of the facets, the L4-5 level in the inferior aspect  of the lamina of L4 bilaterally up to its insertion site.  Decompressing  the lateral recesses and process, and also freeing up the superior  attachments, soft tissue attachments to the loosened neural arch at L5.  Similarly, at the L5-S1 level, then the attachments of ligamentum flavum  to the inferior aspect of the L5 lamina were freed up using the Kerrison  dividing off medial aspect facet here.  Leksell rongeur was then used to  grasp the lamina of L5 and lifted up removing the neural arch as a  single fragment using curettes, debriding the soft tissue attachments  here.  Once this removed, then, ligamentum flavum of the L5-S1 level was  inferiorly debrided, resected off the superior aspect of the S1 lamina  on both sides.  Decompressive foraminotomy over both S1 nerve roots  carried out in the medial aspects of the S1.  Superior articular process  was debrided and the superior articular process itself was resected on  the right side, it was  entirely back to the pedicle of S1 superior  aspect.  Lateral recesses at L4-5 was then further decompressed using  loupe magnification and headlamps throughout this portion of the case.  This was continue out over the L5 nerve roots bilaterally resecting  residual scar tissue from previous pars defects, hypertrophic bone that  were formed here completely decompressing both L5 nerve roots in the  process.  This completed and the thecal sac was retracted medially at  the L5-S1 level on the right side and bipolar electrocautery used to  cauterize the epidural veins here.  Incision was then made into the disk  using a 15 blade scalpel.   Attention was then turned to the L4-5 level and the thecal sac was  retracted on the left side.  Lateral recess decompression was further  carried out using 3 and 4 mm Kerrison.  Decompressing the lateral recess  at L4-5 overlying the L5 nerve root as they exit on the left side.  Similarly, so on the right side.  Thecal sac was retracted medially and  the L4-5 disk explored on the left side, found to be bulging, but not  herniated.  Similarly, on the right side over the L5 nerve root was  tethered, there was scar tissue, epidural veins that were lesioning the  L5 nerve root.  These were carefully lifted away from the L5 root,  cauterized, and divided with the 15 blade scalpel.  The disk itself  appeared to be protruded on the right side.  A 15 blade scalpel was used  to make a longitudinal incision at this disk.  Micropituitary explored  the disk, no herniation was found to be present and it was felt to  represent degenerated disk primarily at L4-5 level without herniation.  Continuing the procedure at this point on the left side, then the  transverse process of L5 and its intersection with the superior  articular process of L5 lateral portion, the pedicle of L5 was then  carefully identified, soft tissue attachment resected.  Preserving the  capsule at  L4-5 here.  An awl used to make an initial entry point into  the lateral aspect of the inferior portion of the superior articular  process were resected with the transverse process of L5.  Intraoperative  flora used to ascertain correct position and alignment for this line  appropriately with the pedicle, and then a pedicle probe was inserted,  penetrating paralumbar muscles in order to obtain some degree of  conversion.  This was then able to be placed in pedicle probe to depth  of 40 mm, observed on C-arm fluoro to be in good position and alignment.  This was then tapped using 5.5 tap, each step of the way tested with the  ball-tip probe to ensure patency of the pedicle that was probed and  showed no sign of broaching of cortex.  Following tapping, then  declaudication was carried down over the lateral aspect of the superior  articular process of L5 and the transverse process in the left side.  Bone graft that was harvested from central lams and the Gill procedure  was then placed out posterolateral on the left side.  A 40 mm x 6.25  screw was then used to place the screw then on the left side of the L5.  Similarly, an awl was then used to make an entry point into the inferior  aspect to the superior articular process of S1 on the left and this was  found to be at correct position and alignment, while entering into the  pedicle at S1.  High-speed bur was used to make a small opening into the  cortex posteriorly here and then a handheld pedicle finder used to probe  the pedicle to a depth of 35 mm. A 7.0 x 35-mm screw was chosen.  Tapping with the 6.25 tap, decorticating the ala and residual portions  of the superior articular process of S1 in order to provide for a bone  bed for bone grafting.  Bone graft that was harvested centrally was then  placed on the left side extending from the transverse  process of the L5  to the sacral ala here.  Testing with ball-tip probe, patency of the   channel within the sacral pedicle was determined.  Screw was then placed  without difficulty.  A 35 mm x 7.0 obtaining excellent purchase here.  This completed the left side pedicle screw placement.  Attention then  turned to the right side, where similarly pedicle screws were then  placed at L5 first removing soft tissue attachments to the transverse  process intersection with the lateral aspect to the pedicle at L5 and  the superior articular process of L5 preserving the facet capsule.  Debriding paralumbar muscle were necessary in order to be able to expose  this area.  Bleeders controlled with bipolar electrocautery.  In order  to make an initially entry point into the intersection of the pedicle  with the transverse process, superior articular process of L5 on the  right side observed on C-arm fluoro to be in correct position and  alignment and then a blunt, straight pedicle probe then used to probe  pedicle at L5 on the right side to a depth of 40 mm.  A 6.25 x 40 mm  screw was chosen.  After decortication of transverse process and  rechecking with ball-tip probe to ensure patency of the pedicle opening,  no sign of broaching.  The tapping performed with the 5.5 tap again  checking to make sure with the ball-tip probe that these pedicle are not  been broached.  A 40 mm x 6.25 screw was placed on the right side at a  depth of 40 mm.  This was done without difficulty.  The final screw on  the right side at S1 was carried down using again an awl to make an  entry point at the inferior aspect of the superior articular process of  the S1 observed on C-arm fluoro to be in correct position and alignment.  Bur was then used to decorticate the sacral ala as well as make an  opening in the cortex posteriorly at the inferior aspect of the superior  articular process of S1, and then a blunt-tip straight pedicle finder  was used to probe the pedicle to a depth of 35 mm.  A 35 mm x 7.0 screw  was  chosen.  This was done after first tapping with the 6.25 tap and  again checking with ball-tip probe to ensure patency of the pedicle  without any sign of broaching.  Then, placing a 35 mm x 7.0 screw in the  right side of the S1 level, note the final decortication of the bone  graft was placed over the transverse process of L5 extending up to the  sacral ala on the right side.  Screw was then placed without difficulty  obtaining a excellent purchase.  The patient required reactivation from  her muscle relaxers in order to test soft tissue resistance at the L5  and S1 levels, and while she was undergoing this process, TLIF was begun  on the right side, was carried down on the right side.  The disk space  was dilated using 8, 9, 10, 11-mm dilators providing the best fit.  Disk  space was then debrided with degenerative disk material using pituitary  rongeurs, cartilaginous endplates were then resected down to bleeding  endplate bone using initial straight curette then upbiting right and  upbiting left curettes and the ring curettes straight and ring curettes  angled.  This debrided disk space quite nicely of  any cartilaginous  materials as well as any residual disk material.  Disk space was then  carefully evaluated with trial placement using 9, 10, and 11-mm trial,  which provided best fit.  Lordotic 10-mm cage was chosen.  The disk  space was then packed with the bone grafts that has been obtained from  central laminectomy and Gill procedure.  Additional bone graft was then  packed into the lordotic DePuy Concorde cage measuring 11 mm.  When this  was completely packed in, disk space was then filled with further disk  material and a 9-mm trial used to impact the bone graft within the  intervertebral disk space allowing room for the placement of cage.  Lordotic cage was then placed and correct orientation of the same time  medially, superiorly, inferiorly, and this was then impacted into  place  approximately 35 degrees retroversion into the midline.  Subset well  beneath the posterior aspect of the disk space, posterior aspect of the  L5 vertebral body.  Insertion device was then removed after radiographs  demonstrated the cage well, we proceed within the disk space anteriorly in middle third.  Irrigation was carried out.  Careful inspection of the  spinal canal demonstrated a small amount of bone material that was  replaced into the disk space.  No further bony material was noted within  the neuroforamen or nerve canal on the right side.  Hockey-stick  Neuroprobe was used to probe the L5 neuroforamen at both sides of S1  neuroforamen on both sides and L3 neuroforamen at both sides.  Upon  irrigation then, the patient had obtained 4 twitches and had achieved  resumption of the muscles function.  So, that nerve testing could be  carried out, and soft tissue resistant testing of the screws carried  out.  The soft tissue resistance was carried out and measured greater  than 50 on the right L5, greater than 40 on the right S1, greater than  40 on left L5, and 19 at the left S1.  Carefully inspection of the left  S1 neuroforamen as well as medial aspect of the patient's pedicle  demonstrated that there was no sign of broach or cortex here.  Felt that  this likely was representative of some shortening of the screw within  soft tissue structures medial lateral.  In either case, the S1 nerve  root appeared to be exiting without difficulty.  Following this, an  irrigation was carried out.  A 35-mm precontoured rods were then placed  into the fasteners of each of the screws after loosening the fasteners  with the fastener loosener provided.  Caps were then placed at L5 and S1  capturing the rod at each level.  The screw caps then at the L5 level  were then carefully tightened to 80 foot pounds ensuring enough rod was  visible distal to the fastener to allow for a proper 3-point  fixation  and this was done without any problem.  Compression then obtained  between the right L5 fastener and the S1 fastener with the cap at the S1  level on the right side was then tightened to 80 foot pounds.  Similarly, on the left side compression between the L5 and S1 fasteners  was performed and the caps at the S1 levels then tightened to 80 foot  pounds obtaining fixation that was rigid across the L5-S1 level  posteriorly.  Intraoperative C-arm fluoro obtained in both AP and  lateral planes demonstrating good position and  alignment of the pedicle  screws, rods, and cage centrally located within the disk space at L5 and  S1.  At this time, attention was turned to closure of the incision.  The  irrigation was carried out.  Bleeders controlled using bipolar  electrocautery, thrombin-soaked Gelfoam was necessary.  Most Gelfoam was  removed from this patient's laminotomy site.  Each of the nerve roots  examined using Neuroprobe.  L5 and S1 neuroforamen were widely patent.  L4 nerve roots appeared to be exiting without difficulty.  Thrombin-  soaked Gelfoam was then placed centrally on the right side at the area  of insertion site for the cage.   At this end, closure was carried out immediately with Hemovac drain  placed in the depth of the incision exiting out of the right lower  lumbar spine.  The patient had debridement of soft tissue, paralumbar  muscles bilaterally, any devitalized tissue was resected.  Next, the  paralumbar muscles were loosely approximated in the midline overlying  the laminotomy defect with #1 Vicryl sutures.  The patient's lumbodorsal  fascia reapproximated to spinous process in the spinous ligament  overlying the sacrum and the lumbar spine, interrupted figure-of-eight  sutures and #1 Vicryl.  Deep subcu layers approximated with interrupted  #1 Vicryl sutures and 0-Vicryl sutures.  More subcu layers with  interrupted 2-0 Vicryl sutures and skin closed with a  running subcu  stitch of 4-0 Vicryl.  Tincture of benzoin with Steri-Strips applied.  The patient then had 4 x 4s ABD pads, fixed the skin with hyperfix tape.  Intraoperatively, the patient had a time-out for identification of the  patient, procedure performed, site of this, participants involved.  Preoperative observation had her lumbar spine marked at L4-5 and L5-S1  on the right side and identified the site for performance of the TLIF.  The patient was then reactivated, extubated, and returned to supine  position, and returned to recovery room in satisfactory condition.  All  instrument, sponge counts were correct.      Kerrin Champagne, M.D.  Electronically Signed     JEN/MEDQ  D:  07/15/2008  T:  07/16/2008  Job:  [id number]

## 2011-01-04 NOTE — Op Note (Signed)
NAME:  Catherine [name], Catherine [name]              ACCOUNT NO.:  [id number]   MEDICAL RECORD NO.:  [id number]          PATIENT TYPE:  INP   LOCATION:  5032                         FACILITY:  [facility]   PHYSICIAN:  Burnard Bunting, M.D.    DATE OF BIRTH:  [birth year]   DATE OF PROCEDURE:  12/30/2008  DATE OF DISCHARGE:  01/04/2009                               OPERATIVE REPORT   ADDENDUM:   PREOPERATIVE DIAGNOSIS:  Right knee arthritis.   POSTOPERATIVE DIAGNOSIS:  Right knee arthritis.   PROCEDURE:  Right total knee replacement.   SURGEON:  Burnard Bunting, MD.   ASSISTANT:  Wende Neighbors, PA   The assistance of Maud Deed was required at all times during the  case for retraction of neurovascular structures, exposure of the knee in  order to facilitate placement of guides and jigs as well as to help in  the cementing process for removal of excess cement during the time the  cement hardened as well as assisting in closure of the incision.  Her  assistance was a medical necessity.      Burnard Bunting, M.D.  Electronically Signed     GSD/MEDQ  D:  02/13/2009  T:  02/13/2009  Job:  [id number]

## 2011-01-04 NOTE — Consult Note (Signed)
NAME:  Catherine [name], Catherine [name]              ACCOUNT NO.:  [id number]   MEDICAL RECORD NO.:  [id number]          PATIENT TYPE:  INP   LOCATION:  4706                         FACILITY:  [facility]   PHYSICIAN:  Lucita Ferrara, MD         DATE OF BIRTH:  07-15-1935   DATE OF CONSULTATION:  DATE OF DISCHARGE:                                 CONSULTATION   REFERRING PHYSICIAN:  Kerrin Champagne, M.D.   PRIMARY CARE DOCTOR:  Unassigned.   HISTORY OF PRESENT ILLNESS:  The patient is a 76 year old who is postop  day #4 status post Gill procedure of L5-S1 with lumbar fusion.   REASON FOR CONSULTATION:  From requesting physician, Dr. Otelia Sergeant, was  persistent supraventricular tachycardia.  The patient denied history of  arrhythmia, SVT or atrial fibrillation.  The patient denies a history of  coronary artery disease.  The patient is asymptomatic, denies chest pain  or shortness of breath or palpitations.  The patient denies a history of  thyroid dysfunction.  There is no documented fevers, estimated blood  loss was 1300 mL.  Review of laboratory data shows her electrolytes to  be within normal limits and she it is not excessively anemic.  She did  receive 2 units of packed red blood cells.  Preop hemoglobin was 13.7,  postop she dropped down to 9.3.  She is morbidly obese but she denies  any orthopnea, paroxysmal nocturnal dyspnea or lower extremity edema.  Pulmonary embolism was already ruled out with a CT angiography.   PAST MEDICAL HISTORY:  1. Hypertension.  2. Hypothyroidism.  3. Hyperlipidemia.  4. Chronic back pain.   PAST SURGICAL HISTORY:  As above.   ALLERGIES:  No known drug allergies.   MEDICATIONS:  1. At this time include calcium carbonate 500 mg p.o. b.i.d.  2. Vitamin D3 400 units p.o. daily.  3. Colace 100 mg p.o. b.i.d.  4. Vasotec 10 mg p.o. daily.  5. Heparin 30 subcu q.h.s.  6. Neurontin 300 mg p.o. daily.  7. Hydrochlorothiazide 25 mg p.o. daily.  8. Insulin sliding scale.  9.  Levothyroxine 125 mcg daily.  10.Lopressor 25 mg p.o. b.i.d. which was started after the patient was      found to be in SVT.  Her prior dose was Lopressor 12.5 mg p.o.      b.i.d.  11.Omega 3 fatty acid.  12.Lyrica 100 mg p.o. daily.  13.Zocor 80 mg p.o. daily.  14.Tylenol as needed.  15.Maalox 30 q.6 h p.r.n.  16.Zofran 4 IV q.4 h.  17.APAP/oxycodone.  18.Senna.  19.Zolpidem as needed.   SOCIAL HISTORY:  The patient denies drugs, alcohol or tobacco.   PHYSICAL EXAMINATION:  GENERAL:  Generally speaking the patient is  morbidly obese.  She is comfortable.  VITAL SIGNS:  Temperature 97.6, pulse is currently running somewhere  between 120-138, respirations 20, blood pressure 122/77, pulse ox 95% on  room air.  HEENT:  Normocephalic, atraumatic.  Sclerae anicteric.  NECK:  Supple.  No JVD or carotid bruits.  CARDIOVASCULAR:  S1-S2, regular rate and rhythm.  No murmurs, rubs,  clicks.  LUNGS:  Clear to auscultation bilaterally without rhonchi, rales or  wheezes.  ABDOMEN:  Soft, nontender, obese, positive bowel sounds.  EXTREMITIES:  No clubbing, cyanosis or edema.   LABORATORY DATA:  Magnesium 1.5.  Cardiac markers shows CK total to be  277.  Troponins negative.  Basic metabolic panel, glucose 220,  hemoglobin/hematocrit 11.2/32.6.  Urinalysis essentially negative.  Hemoglobin A1c 6.9.   ASSESSMENT AND PLAN:  1. The patient is a 76 year old found to be in atrial tachycardia,      asymptomatic, but persistent.  2. Status post fusion of back.  3. Diabetes type 2.  4. Chronic back pain.   DISCUSSION AND PLAN:  I was not very impressed that this patient was in  supraventricular tachycardia.  I did not proceed with any adenosine at  this time.  I got a cardiology consultation for further recommendations.  We will utilize beta-blockers and potentially calcium channel blockers.  Her preop 2-D echocardiogram was reviewed and basically it was a limited  study but it was normal.  I  defer to cardiology for further  recommendations if she is persistently elevated she may benefit from  further workup including stress test or ablation if truly found to be in  supraventricular tachycardia.  DVT prophylaxis per orthopedic surgery.  Note that pulmonary embolism has already been ruled out.   Thanks for this consult.      Lucita Ferrara, MD  Electronically Signed     RR/MEDQ  D:  07/19/2008  T:  07/20/2008  Job:  [id number]

## 2011-01-04 NOTE — Op Note (Signed)
NAME:  [name], [name]              ACCOUNT NO.:  [id number]   MEDICAL RECORD NO.:  [id number]          PATIENT TYPE:  INP   LOCATION:  5032                         FACILITY:  [facility]   PHYSICIAN:  Burnard Bunting, M.D.    DATE OF BIRTH:  [birth year]   DATE OF PROCEDURE:  12/30/2008  DATE OF DISCHARGE:                               OPERATIVE REPORT   PREOPERATIVE DIAGNOSES:  Right knee arthritis.   POSTOPERATIVE DIAGNOSES:  Right knee arthritis.   PROCEDURE:  Right total knee replacement.   SURGEON:  Burnard Bunting, MD   ASSISTANT:  None.   ANESTHESIA:  General endotracheal.   ESTIMATED BLOOD LOSS:  150.   DRAINS:  Hemovac x1.   INDICATIONS:  Catherine [name] is a 76 year old female with right knee  arthritis who presents now for operative management after explanation of  risks and benefits.   TOURNIQUET TIME:  1 hour 46 minutes at 350 mmHg.   COMPONENTS UTILIZED:  DePuy rotating platform cemented 4, narrow femur  3, tibia 10, poly 32, and patella.   PROCEDURE IN DETAIL:  The patient was brought to the operating room  where general endotracheal anesthesia was induced.  Preoperative  antibiotics were administered.  Right leg was prepped and prescrubbed  with chlorhexidine, alcohol, and Betadine, which allowed to air dry then  prepped in DuraPrep solution and draped in the sterile manner using  Ioban.  Time-out was called.  Leg was elevated,  but not exsanguinated.  Tourniquet was inflated initial inflation of 300 mmHg did not give  adequate hemostasis, therefore 350 was utilized.  Anterior approach knee  incision was made.  Skin and subcutaneous tissue was sharply divided.  A  median parapatellar arthrotomy was made.  Presized location of the  arthrotomy was marked with #1 Vicryl suture.  Patella was everted.  Lateral patellofemoral ligaments were released.  Fat pad was partially  incised.  Tissue on the distal anterior femur was removed.  Osteophytes  were removed from the  femur and tibia.  ACL, PCL were released, 2 pins  were placed in the distal medial femur and proximal medial tibia.  Registration points were achieved including the hips in rotation as well  as bilateral axis in various points around the knee.  In accordance with  preoperative templating for assistance, the tibia was cut perpendicular  mechanical axis with the collateral and posterior ligament structures  well protected.  Tension devices was in place and in full extension and  flexion and distal femoral cut was then made and checked.  The space  blocks for adequacy of resection.  Anterior, posterior, and chamfer cuts  were then made along with a box cut.  The tibia was then keel punched  and patella was prepared in free hand fashion with 23-mm patella being  resected to 12 mm and 3-peg patella placed.  Part of the bipartite  patella was excised.  At this time, trial components were placed with  thin poly spacer.  The patient had achieved full extension, full flexion  with good stability of varus and valgus stresses 0, 30, and 90  degrees.  There is no patellar maltracking.  Trial components were released.  True  components were then cemented into position with excess cement removed.  Same stability parameters were maintained via 10 poly.  Tourniquet was  released.  Bleeding points were encountered and controlled with  electrocautery.  Knee joint was thoroughly irrigated before cementing,  after cementing and after the tourniquet was released.  Hemovac drain  was placed.  Incision was then closed over bolster using #1 Vicryl  suture interrupted and inverted 2-0 Vicryl suture and skin staples.  Small incisions for the pins were irrigated and placed using single 3-0  Nylon suture.  The patient was then placed in a bulky dressing.  Solution of Marcaine and morphine finally injected into the knee.  She  tolerated the procedure well without immediate complications.  [name]  Vernon's assistance  was required all the time during the case for  retraction of important neurovascular structures.  Her assistance was a  medical necessity.      Burnard Bunting, M.D.  Electronically Signed     GSD/MEDQ  D:  12/30/2008  T:  12/31/2008  Job:  [id number]

## 2011-01-07 NOTE — Discharge Summary (Signed)
NAME:  Catherine [name], Catherine [name]              ACCOUNT NO.:  [id number]   MEDICAL RECORD NO.:  [id number]          PATIENT TYPE:  INP   LOCATION:  5032                         FACILITY:  [facility]   PHYSICIAN:  Burnard Bunting, M.D.    DATE OF BIRTH:  [birth year]   DATE OF ADMISSION:  12/30/2008  DATE OF DISCHARGE:  01/04/2009                               DISCHARGE SUMMARY   ADMISSION DIAGNOSES:  1. Right knee osteoarthritis.  2. Morbid obesity.  3. Diabetes mellitus.  4. Hypertension.  5. Hypothyroidism.  6. Asthma.  7. History of lumbar fusion at L4-5 and L5-S1.   DISCHARGE DIAGNOSES:  1. Right knee osteoarthritis.  2. Morbid obesity.  3. Diabetes mellitus.  4. Hypertension.  5. Hypothyroidism.  6. Asthma.  7. History of lumbar fusion at L4-5 and L5-S1.  8. Post-hemorrhagic anemia.   PROCEDURE:  On Dec 30, 2008, the patient underwent right total knee  arthroplasty performed by Dr. August Saucer, assisted by Maud Deed, [facility],  under general anesthesia.   CONSULTATIONS:  None.   BRIEF HISTORY:  The patient is a 76 year old female with end-stage  arthritis of the right knee.  She has failed conservative treatment.  She is now having chronic and progressive pain inhibiting her activity.  It was felt she would benefit from surgical intervention and after risk  and benefits discussed at length with Dr. August Saucer the patient wished to  proceed with a right total knee replacement.   BRIEF HOSPITAL COURSE:  The patient tolerated the procedure under  general anesthesia without complications.  Postoperatively,  neurovascular and motor function of the lower extremities were noted to  be intact.  Dressing changes were done daily and the patient's wound was  healing without drainage.  The patient was started on physical therapy  for ambulation and gait training.  She eventually was able to ambulate  200 feet with a rolling walker and standby assistance.  Range of motion,  stretching and strengthening  exercises were performed.  Knee immobilizer  was utilized for ambulation.  CPM was utilized for passive range of  motion.  Occupational therapy assisted with ADLs.  Coumadin was started  for DVT prophylaxis with adjustments made according to daily INRs by the  pharmacist.  The patient had a nutrition consult secondary to her morbid  obesity with BMI of 44.7 with dietary guidelines given.  The patient's  pain was controlled with PCA analgesics initially and the patient was  weaned to p.o. analgesics.  She did have some elevations in her fever  and was treated with incentive spirometry.  Postoperatively, hemoglobin  and hematocrit dropped to the lowest value of 8.8 and 24.6.  At  discharge, INR was noted to be 1.9.  Chemistry studies on admission were  within normal limits and repeat during the hospital stay were within  normal limits.  She did have mildly elevated glucoses with highest value  being 165.  Urinalysis on admission was negative for urinary tract  infection and urine culture with final result on Dec 27, 2008, showed  10,000 colonies of multiple bacteria morphotypes.  The patient was able  to void independently once her catheter was discontinued.  On Jan 04, 2009, she was stable for discharge to her home.   PLAN:  Arrangements were made for home health physical therapy and  Coumadin management as well as durable medical equipment through  [facility].  She will follow up with Dr. August Saucer in 2 weeks.  Keep her  incision dry and clean.  Continue with ambulation and gait weightbearing  as tolerated, range of motion, stretching, and strengthening exercises.  Prescriptions at discharge, Tylox and Coumadin.  She was given her  medication reconciliation form with instructions to continue home  medications as taken prior to admission with the exception of  diclofenac.  She will call the office should she have questions or  concerns prior to her return office visit.      Wende Neighbors,  P.A.      Burnard Bunting, M.D.  Electronically Signed    SMV/MEDQ  D:  02/12/2009  T:  02/13/2009  Job:  [id number]

## 2011-01-07 NOTE — Discharge Summary (Signed)
NAME:  Catherine [name], Catherine [name]              ACCOUNT NO.:  [id number]   MEDICAL RECORD NO.:  [id number]          PATIENT TYPE:  INP   LOCATION:  4706                         FACILITY:  [facility]   PHYSICIAN:  Kerrin Champagne, M.D.   DATE OF BIRTH:  1934-11-30   DATE OF ADMISSION:  07/15/2008  DATE OF DISCHARGE:  07/21/2008                               DISCHARGE SUMMARY   ADMISSION DIAGNOSES:  1. Foraminal stenosis, bilateral L5-S1 with mild lateral recess      stenosis L4-5, isthmic; spondylolisthesis, L5-S1 with bilateral      pars defect and grade 1 slit.  2. Diabetes mellitus.  3. Hypertension.  4. Hypothyroidism.  5. History of asthma and chronic bronchitis.   DISCHARGE DIAGNOSIS:  1. Foraminal stenosis, bilateral L5-S1 with mild lateral recess      stenosis L4-5, isthmic; spondylolisthesis, L5-S1 with bilateral      pars defect and grade 1 slit.  2. Diabetes mellitus.  3. Hypertension.  4. Hypothyroidism.  5. History of asthma and chronic bronchitis.  6. New onset supraventricular tachycardia.  7. Acute blood loss anemia requiring blood transfusion.   PROCEDURE:  On July 15, 2008, the patient underwent Gill procedure  L5-S1 with right transforaminal lumbar interbody fusion with local bone  graft and lordotic Concorde cage.  Posterolateral fusion L5-S1 with  local bone graft and pedicle screw and rod instrumentation.  Central  laminectomy, L4-5 with bilateral lateral recess decompression, right-  sided disk incision an exploration for herniated nucleus pulposus.  This  was performed by Dr. Otelia Sergeant, assisted by Maud Deed, [facility] under general  anesthesia.   CONSULTATIONS:  1. Nanetta Batty, MD, for Cardiology  2. Lucita Ferrara, MD, for Internal Medicine.   BRIEF HISTORY:  The patient is a 76 year old female with chronic and  progressive low back pain.  She has had worsening symptoms of neurogenic  claudication with radiation of pain and numbness in the lower  extremities with  inability to stand and ambulate.  She has failed  conservative management including anti-inflammatory medications as well  as epidural steroid injections.  She has been noted to have a grade 1  spondylolisthesis on CT scan as well as central stenosis with bilateral  L4 nerve root entrapment.  It was felt she would benefit from surgical  intervention, and was admitted for the procedure as stated above.   BRIEF HOSPITAL COURSE:  The patient tolerated the procedure under  general anesthesia without complications.  On the first postoperative  day, her drain was removed from her wound and dressing changes were  started.  Wound was healing well throughout the hospital stay.  Her  diabetes was controlled with sliding scale insulin.  Neurovascular motor  function of the lower extremities was noted to be intact.  The patient  had some dizziness with standing and ambulating.  Her hemoglobin and  hematocrit dropped postoperatively to 8.3 and 23.3.  She received 2  units of packed red blood cells on the first postoperative day.  Physical therapy was then started and the patient utilized her brace  full-time when out of bed.  She was  able to use a walker for ambulation.  The patient was comfortable with PCA analgesics.  She was gradually  weaned to p.o. analgesics without difficulty.  On July 19, 2008, the  patient developed tachycardia with sustained values in the 130s.  A  rapid response team was called for assistance.  Her EKG showed  supraventricular tachycardia.  Her vital signs were stable with the  exception of heart rate of 130.  She was placed on the Telemetry Unit  and medical consult was obtained.  Cardiac consult was also obtained.  Spiral CT of the chest was done and it was negative for pulmonary  embolism.  The patient continued on the telemetry floor.  Her physical  therapy was continued.  It was finally felt that the sinus tachycardia  was related to pain.  Her pain was better  controlled with oral  analgesics.  Beta-blockers were initiated and adjusted to appropriate  dose before discharge.  Prior to discharge, the patient was ambulating  in the hallway utilizing her walker and her brace.  Neurovascular motor  function of the lower extremities was noted to be intact.   PERTINENT LABORATORY VALUES:  Admission CBC with hemoglobin and  hematocrit 13.7 and 39.1.  Hemoglobin and hematocrit dropped to 8.3 and  23.3 requiring 2 units of packed red blood cells and returned to level  of 10.0 and 31.0.  Coagulation studies on admission within normal  limits.  Chemistry studies normal on admission with exception of BUN 27.  One episode of hyponatremia treated with a bolus of sodium chloride.  One episode of hypokalemia at 3.3, treated with supplementation.  Elevated blood sugars noted.  Hemoglobin A1c at 6.9.  Urinalysis on  admission, 3-6 WBCs per high-power field, 0-2 RBCs per high-power field.  Repeat urinalysis on July 17, 2008; 0-2 WBCs, 7-10 RBCs, and uric  acid crystals noted.  On July 19, 2008, to July 20, 2008,  cardiac markers were obtained.  Normal values for CK-MB, troponin, and  relative index.  CK elevated at 277 and down to 209 at the last blood  draw.  TSH at 8.182 on July 19, 2008.  On July 21, 2008; the  patient was stable for discharge to home.  She was given instructions in  regards to medication changes.   PLAN:  The patient will continue on a low-carbohydrate diet.  She was  instructed to follow up with her primary care physician in regards to  her blood sugars.   HOME MEDICATIONS:  She will resume her home medications as taken prior  to admission and medication reconciliation form was given to her with  these instructions.  1. Her Synthroid was increased to 150 mcg daily.  2. She was given prescriptions for Percocet 5/325 one to two every 4-6      hours as needed for pain.  3. Neurontin 300 mg p.o. nightly.  4. Lyrica 100  mg 1 p.o. t.i.d.   The patient will follow up with Dr. Otelia Sergeant in 2 weeks from surgery.  Arrangements were made through [facility] for home health  physical therapy evaluation and durable medical equipment.  The patient  will keep her  incision dry and clean until there is no drainage and then she may  shower.  Dressing change to be done daily.  She will increase her  activity slowly.  No bending, lifting, twisting, or driving.  All  questions encouraged and answered.   CONDITION ON DISCHARGE:  Stable.  Wende Neighbors, P.A.      Kerrin Champagne, M.D.  Electronically Signed    SMV/MEDQ  D:  08/28/2008  T:  08/29/2008  Job:  [id number]

## 2011-05-24 LAB — URINALYSIS, ROUTINE W REFLEX MICROSCOPIC
Glucose, UA: 250 mg/dL — AB
Ketones, ur: 15 mg/dL — AB
Nitrite: NEGATIVE
Protein, ur: NEGATIVE mg/dL
Urobilinogen, UA: 1 mg/dL (ref 0.0–1.0)

## 2011-05-24 LAB — TYPE AND SCREEN
ABO/RH(D): O POS
Antibody Screen: NEGATIVE

## 2011-05-24 LAB — DIFFERENTIAL
Basophils Absolute: 0 10*3/uL (ref 0.0–0.1)
Basophils Relative: 0 % (ref 0–1)
Eosinophils Absolute: 0.1 10*3/uL (ref 0.0–0.7)
Eosinophils Relative: 2 % (ref 0–5)
Lymphocytes Relative: 27 % (ref 12–46)
Lymphs Abs: 1.6 10*3/uL (ref 0.7–4.0)
Monocytes Absolute: 0.4 10*3/uL (ref 0.1–1.0)
Monocytes Relative: 7 % (ref 3–12)
Neutro Abs: 3.9 10*3/uL (ref 1.7–7.7)
Neutrophils Relative %: 64 % (ref 43–77)

## 2011-05-24 LAB — CARDIAC PANEL(CRET KIN+CKTOT+MB+TROPI): Troponin I: 0.01 ng/mL (ref 0.00–0.06)

## 2011-05-24 LAB — BASIC METABOLIC PANEL
BUN: 10 mg/dL (ref 6–23)
BUN: 22 mg/dL (ref 6–23)
BUN: 26 mg/dL — ABNORMAL HIGH (ref 6–23)
CO2: 29 mEq/L (ref 19–32)
CO2: 29 mEq/L (ref 19–32)
Calcium: 8 mg/dL — ABNORMAL LOW (ref 8.4–10.5)
Chloride: 100 mEq/L (ref 96–112)
Chloride: 105 mEq/L (ref 96–112)
Chloride: 97 mEq/L (ref 96–112)
Creatinine, Ser: 0.72 mg/dL (ref 0.4–1.2)
Creatinine, Ser: 0.92 mg/dL (ref 0.4–1.2)
GFR calc Af Amer: >60 mL/min (ref >60)
GFR calc non Af Amer: >60 mL/min (ref >60)
GFR calc non Af Amer: >60 mL/min (ref >60)
GFR calc non Af Amer: >60 mL/min (ref >60)
Glucose, Bld: 150 mg/dL — ABNORMAL HIGH (ref 70–99)
Glucose, Bld: 169 mg/dL — ABNORMAL HIGH (ref 70–99)
Glucose, Bld: 220 mg/dL — ABNORMAL HIGH (ref 70–99)
Glucose, Bld: 231 mg/dL — ABNORMAL HIGH (ref 70–99)
Potassium: 3.5 mEq/L (ref 3.5–5.1)
Potassium: 3.8 mEq/L (ref 3.5–5.1)
Potassium: 4.1 mEq/L (ref 3.5–5.1)
Potassium: 4.2 mEq/L (ref 3.5–5.1)
Sodium: 135 mEq/L (ref 135–145)
Sodium: 138 mEq/L (ref 135–145)

## 2011-05-24 LAB — URINALYSIS, MICROSCOPIC ONLY
Bilirubin Urine: NEGATIVE
Glucose, UA: 500 mg/dL — AB
Ketones, ur: NEGATIVE mg/dL
Specific Gravity, Urine: 1.016 (ref 1.005–1.030)
pH: 5.5 (ref 5.0–8.0)

## 2011-05-24 LAB — GLUCOSE, CAPILLARY
Glucose-Capillary: 152 mg/dL — ABNORMAL HIGH (ref 70–99)
Glucose-Capillary: 166 mg/dL — ABNORMAL HIGH (ref 70–99)
Glucose-Capillary: 166 mg/dL — ABNORMAL HIGH (ref 70–99)
Glucose-Capillary: 169 mg/dL — ABNORMAL HIGH (ref 70–99)
Glucose-Capillary: 173 mg/dL — ABNORMAL HIGH (ref 70–99)
Glucose-Capillary: 180 mg/dL — ABNORMAL HIGH (ref 70–99)
Glucose-Capillary: 196 mg/dL — ABNORMAL HIGH (ref 70–99)
Glucose-Capillary: 200 mg/dL — ABNORMAL HIGH (ref 70–99)
Glucose-Capillary: 211 mg/dL — ABNORMAL HIGH (ref 70–99)
Glucose-Capillary: 222 mg/dL — ABNORMAL HIGH (ref 70–99)
Glucose-Capillary: 227 mg/dL — ABNORMAL HIGH (ref 70–99)

## 2011-05-24 LAB — CK TOTAL AND CKMB (NOT AT ARMC)
CK, MB: 3.5 ng/mL (ref 0.3–4.0)
Relative Index: 1.5 (ref 0.0–2.5)

## 2011-05-24 LAB — COMPREHENSIVE METABOLIC PANEL
ALT: 37 U/L — ABNORMAL HIGH (ref 0–35)
AST: 25 U/L (ref 0–37)
Calcium: 10.1 mg/dL (ref 8.4–10.5)
GFR calc Af Amer: >60 mL/min (ref >60)
Sodium: 140 mEq/L (ref 135–145)
Total Protein: 6.9 g/dL (ref 6.0–8.3)

## 2011-05-24 LAB — HEMOGLOBIN AND HEMATOCRIT, BLOOD
HCT: 23.3 % — ABNORMAL LOW (ref 36.0–46.0)
HCT: 26.6 % — ABNORMAL LOW (ref 36.0–46.0)
HCT: 30.9 % — ABNORMAL LOW (ref 36.0–46.0)
Hemoglobin: 11.2 g/dL — ABNORMAL LOW (ref 12.0–15.0)
Hemoglobin: 9.3 g/dL — ABNORMAL LOW (ref 12.0–15.0)

## 2011-05-24 LAB — CBC
MCHC: 34.1 g/dL (ref 30.0–36.0)
MCHC: 35 g/dL (ref 30.0–36.0)
Platelets: 212 10*3/uL (ref 150–400)
RBC: 4.34 MIL/uL (ref 3.87–5.11)
RDW: 13.4 % (ref 11.5–15.5)
RDW: 13.7 % (ref 11.5–15.5)

## 2011-05-24 LAB — MAGNESIUM: Magnesium: 1.5 mg/dL (ref 1.5–2.5)

## 2011-05-24 LAB — TSH: TSH: 8.182 u[IU]/mL — ABNORMAL HIGH (ref 0.350–4.500)

## 2011-05-24 LAB — PROTIME-INR: INR: 1 (ref 0.00–1.49)

## 2011-05-24 LAB — HEMOGLOBIN A1C: Hgb A1c MFr Bld: 6.9 % — ABNORMAL HIGH (ref 4.6–6.1)

## 2011-05-24 LAB — PREPARE RBC (CROSSMATCH)

## 2011-05-24 LAB — ABO/RH: ABO/RH(D): O POS

## 2011-05-24 LAB — URINE MICROSCOPIC-ADD ON

## 2011-10-26 ENCOUNTER — Encounter (HOSPITAL_COMMUNITY): Payer: Self-pay | Admitting: Anesthesiology

## 2011-10-26 ENCOUNTER — Encounter (HOSPITAL_COMMUNITY)
Admission: EM | Disposition: A | Discharge status: Skilled Nursing Facility | Payer: Self-pay | Source: Ambulatory Visit | Attending: Orthopedic Surgery

## 2011-10-26 ENCOUNTER — Other Ambulatory Visit: Payer: Self-pay

## 2011-10-26 ENCOUNTER — Inpatient Hospital Stay (HOSPITAL_COMMUNITY): Payer: Medicare Other

## 2011-10-26 ENCOUNTER — Inpatient Hospital Stay (HOSPITAL_COMMUNITY)
Admission: EM | Admit: 2011-10-26 | Discharge: 2011-11-02 | DRG: 485 | Disposition: A | Discharge status: Skilled Nursing Facility | Payer: Medicare Other | Source: Ambulatory Visit | Attending: Orthopedic Surgery | Admitting: Orthopedic Surgery

## 2011-10-26 ENCOUNTER — Inpatient Hospital Stay (HOSPITAL_COMMUNITY): Payer: Medicare Other | Admitting: Anesthesiology

## 2011-10-26 DIAGNOSIS — G929 Unspecified toxic encephalopathy: Secondary | ICD-10-CM | POA: Diagnosis not present

## 2011-10-26 DIAGNOSIS — M009 Pyogenic arthritis, unspecified: Secondary | ICD-10-CM | POA: Diagnosis present

## 2011-10-26 DIAGNOSIS — M25561 Pain in right knee: Secondary | ICD-10-CM

## 2011-10-26 DIAGNOSIS — M5430 Sciatica, unspecified side: Secondary | ICD-10-CM | POA: Insufficient documentation

## 2011-10-26 DIAGNOSIS — Z6841 Body Mass Index (BMI) 40.0 and over, adult: Secondary | ICD-10-CM

## 2011-10-26 DIAGNOSIS — E039 Hypothyroidism, unspecified: Secondary | ICD-10-CM | POA: Insufficient documentation

## 2011-10-26 DIAGNOSIS — T8453XA Infection and inflammatory reaction due to internal right knee prosthesis, initial encounter: Secondary | ICD-10-CM

## 2011-10-26 DIAGNOSIS — R000 Tachycardia, unspecified: Secondary | ICD-10-CM

## 2011-10-26 DIAGNOSIS — E1149 Type 2 diabetes mellitus with other diabetic neurological complication: Secondary | ICD-10-CM | POA: Insufficient documentation

## 2011-10-26 DIAGNOSIS — I498 Other specified cardiac arrhythmias: Secondary | ICD-10-CM | POA: Diagnosis not present

## 2011-10-26 DIAGNOSIS — I10 Essential (primary) hypertension: Secondary | ICD-10-CM | POA: Insufficient documentation

## 2011-10-26 DIAGNOSIS — E1142 Type 2 diabetes mellitus with diabetic polyneuropathy: Secondary | ICD-10-CM | POA: Diagnosis present

## 2011-10-26 DIAGNOSIS — A4902 Methicillin resistant Staphylococcus aureus infection, unspecified site: Secondary | ICD-10-CM | POA: Diagnosis present

## 2011-10-26 DIAGNOSIS — F17200 Nicotine dependence, unspecified, uncomplicated: Secondary | ICD-10-CM | POA: Diagnosis present

## 2011-10-26 DIAGNOSIS — G4733 Obstructive sleep apnea (adult) (pediatric): Secondary | ICD-10-CM | POA: Insufficient documentation

## 2011-10-26 DIAGNOSIS — K219 Gastro-esophageal reflux disease without esophagitis: Secondary | ICD-10-CM | POA: Insufficient documentation

## 2011-10-26 DIAGNOSIS — G92 Toxic encephalopathy: Secondary | ICD-10-CM | POA: Diagnosis not present

## 2011-10-26 DIAGNOSIS — F341 Dysthymic disorder: Secondary | ICD-10-CM | POA: Diagnosis present

## 2011-10-26 DIAGNOSIS — Z96659 Presence of unspecified artificial knee joint: Secondary | ICD-10-CM

## 2011-10-26 DIAGNOSIS — T8450XA Infection and inflammatory reaction due to unspecified internal joint prosthesis, initial encounter: Principal | ICD-10-CM | POA: Diagnosis present

## 2011-10-26 DIAGNOSIS — E876 Hypokalemia: Secondary | ICD-10-CM | POA: Diagnosis not present

## 2011-10-26 DIAGNOSIS — R32 Unspecified urinary incontinence: Secondary | ICD-10-CM | POA: Diagnosis not present

## 2011-10-26 DIAGNOSIS — Y831 Surgical operation with implant of artificial internal device as the cause of abnormal reaction of the patient, or of later complication, without mention of misadventure at the time of the procedure: Secondary | ICD-10-CM | POA: Diagnosis present

## 2011-10-26 DIAGNOSIS — I509 Heart failure, unspecified: Secondary | ICD-10-CM | POA: Diagnosis not present

## 2011-10-26 DIAGNOSIS — D649 Anemia, unspecified: Secondary | ICD-10-CM

## 2011-10-26 DIAGNOSIS — A4901 Methicillin susceptible Staphylococcus aureus infection, unspecified site: Secondary | ICD-10-CM | POA: Diagnosis present

## 2011-10-26 HISTORY — PX: I&D KNEE WITH POLY EXCHANGE: SHX5024

## 2011-10-26 LAB — COMPREHENSIVE METABOLIC PANEL
ALT: 31 U/L (ref 0–35)
AST: 26 U/L (ref 0–37)
Albumin: 3.6 g/dL (ref 3.5–5.2)
Alkaline Phosphatase: 39 U/L (ref 39–117)
Chloride: 102 mEq/L (ref 96–112)
Creatinine, Ser: 1.56 mg/dL — ABNORMAL HIGH (ref 0.50–1.10)
Potassium: 3.6 mEq/L (ref 3.5–5.1)
Sodium: 141 mEq/L (ref 135–145)
Total Bilirubin: 0.7 mg/dL (ref 0.3–1.2)

## 2011-10-26 LAB — TYPE AND SCREEN
ABO/RH(D): O POS
Antibody Screen: NEGATIVE

## 2011-10-26 LAB — MRSA PCR SCREENING: MRSA by PCR: NEGATIVE

## 2011-10-26 LAB — DIFFERENTIAL
Basophils Absolute: 0 10*3/uL (ref 0.0–0.1)
Basophils Relative: 0 % (ref 0–1)
Neutro Abs: 10.6 10*3/uL — ABNORMAL HIGH (ref 1.7–7.7)
Neutrophils Relative %: 87 % — ABNORMAL HIGH (ref 43–77)

## 2011-10-26 LAB — CBC
MCHC: 34.2 g/dL (ref 30.0–36.0)
Platelets: 131 10*3/uL — ABNORMAL LOW (ref 150–400)
RDW: 14.1 % (ref 11.5–15.5)
WBC: 12.2 10*3/uL — ABNORMAL HIGH (ref 4.0–10.5)

## 2011-10-26 LAB — GLUCOSE, CAPILLARY: Glucose-Capillary: 153 mg/dL — ABNORMAL HIGH (ref 70–99)

## 2011-10-26 SURGERY — IRRIGATION AND DEBRIDEMENT KNEE WITH POLY EXCHANGE
Anesthesia: General | Site: Knee | Laterality: Right | Wound class: Dirty or Infected

## 2011-10-26 MED ORDER — FUROSEMIDE 10 MG/ML IJ SOLN
40.0000 mg | INTRAMUSCULAR | Status: AC
  Administered 2011-10-26: 40 mg via INTRAVENOUS

## 2011-10-26 MED ORDER — VANCOMYCIN HCL 1000 MG IV SOLR
1000.0000 mg | INTRAVENOUS | Status: DC
  Filled 2011-10-26: qty 1000

## 2011-10-26 MED ORDER — METHOCARBAMOL 500 MG PO TABS
500.0000 mg | ORAL_TABLET | Freq: Four times a day (QID) | ORAL | Status: DC | PRN
  Administered 2011-10-30 – 2011-10-31 (×3): 500 mg via ORAL
  Filled 2011-10-26 (×4): qty 1

## 2011-10-26 MED ORDER — VANCOMYCIN HCL 500 MG IV SOLR
INTRAVENOUS | Status: DC | PRN
  Administered 2011-10-26: 500 mg via TOPICAL

## 2011-10-26 MED ORDER — ACETAMINOPHEN 650 MG RE SUPP
650.0000 mg | Freq: Four times a day (QID) | RECTAL | Status: DC | PRN
  Administered 2011-10-27: 650 mg via RECTAL
  Filled 2011-10-26 (×2): qty 1

## 2011-10-26 MED ORDER — SODIUM CHLORIDE 0.9 % IV SOLN
INTRAVENOUS | Status: DC
  Administered 2011-10-26: 1000 mL via INTRAVENOUS
  Administered 2011-10-26: 10:00:00 via INTRAVENOUS

## 2011-10-26 MED ORDER — VANCOMYCIN HCL 500 MG IV SOLR
500.0000 mg | Freq: Once | INTRAVENOUS | Status: AC
  Administered 2011-10-26: 500 mg via INTRAVENOUS
  Filled 2011-10-26: qty 500

## 2011-10-26 MED ORDER — METHOCARBAMOL 100 MG/ML IJ SOLN
500.0000 mg | Freq: Four times a day (QID) | INTRAMUSCULAR | Status: DC | PRN
  Administered 2011-10-27: 500 mg via INTRAVENOUS
  Filled 2011-10-26 (×2): qty 5

## 2011-10-26 MED ORDER — MORPHINE SULFATE 2 MG/ML IJ SOLN: 1.0000 mg | INTRAMUSCULAR | Status: DC | PRN

## 2011-10-26 MED ORDER — GENTAMICIN SULFATE 40 MG/ML IJ SOLN
INTRAMUSCULAR | Status: DC | PRN
  Administered 2011-10-26: 80 mg via INTRAMUSCULAR

## 2011-10-26 MED ORDER — INSULIN GLARGINE 100 UNIT/ML ~~LOC~~ SOLN: 10.0000 [IU] | Freq: Every day | SUBCUTANEOUS | Status: DC

## 2011-10-26 MED ORDER — ONDANSETRON HCL 4 MG PO TABS: 4.0000 mg | ORAL_TABLET | Freq: Four times a day (QID) | ORAL | Status: DC | PRN

## 2011-10-26 MED ORDER — SUCCINYLCHOLINE CHLORIDE 20 MG/ML IJ SOLN
INTRAMUSCULAR | Status: DC | PRN
  Administered 2011-10-26: 120 mg via INTRAVENOUS

## 2011-10-26 MED ORDER — PROPRANOLOL HCL 1 MG/ML IV SOLN
INTRAVENOUS | Status: DC | PRN
  Administered 2011-10-26: .25 mg via INTRAVENOUS

## 2011-10-26 MED ORDER — ACETAMINOPHEN 500 MG PO TABS
500.0000 mg | ORAL_TABLET | Freq: Once | ORAL | Status: AC
  Administered 2011-10-26: 500 mg via ORAL
  Filled 2011-10-26: qty 1

## 2011-10-26 MED ORDER — ACETAMINOPHEN 325 MG PO TABS
650.0000 mg | ORAL_TABLET | Freq: Four times a day (QID) | ORAL | Status: DC | PRN
  Administered 2011-10-27 – 2011-10-28 (×6): 650 mg via ORAL
  Filled 2011-10-26 (×6): qty 2

## 2011-10-26 MED ORDER — LIDOCAINE HCL (CARDIAC) 20 MG/ML IV SOLN
INTRAVENOUS | Status: DC | PRN
  Administered 2011-10-26: 40 mg via INTRAVENOUS

## 2011-10-26 MED ORDER — WARFARIN - PHARMACIST DOSING INPATIENT: Freq: Every day | Status: DC

## 2011-10-26 MED ORDER — METOPROLOL SUCCINATE ER 25 MG PO TB24
25.0000 mg | ORAL_TABLET | Freq: Two times a day (BID) | ORAL | Status: DC
  Administered 2011-10-26 – 2011-10-29 (×6): 25 mg via ORAL
  Filled 2011-10-26 (×8): qty 1

## 2011-10-26 MED ORDER — ONDANSETRON HCL 4 MG/2ML IJ SOLN: 4.0000 mg | Freq: Four times a day (QID) | INTRAMUSCULAR | Status: DC | PRN

## 2011-10-26 MED ORDER — ONDANSETRON HCL 4 MG/2ML IJ SOLN
INTRAMUSCULAR | Status: DC | PRN
  Administered 2011-10-26: 4 mg via INTRAVENOUS

## 2011-10-26 MED ORDER — WARFARIN SODIUM 5 MG PO TABS
5.0000 mg | ORAL_TABLET | Freq: Once | ORAL | Status: DC
  Filled 2011-10-26: qty 1

## 2011-10-26 MED ORDER — PHENYLEPHRINE HCL 10 MG/ML IJ SOLN
INTRAMUSCULAR | Status: DC | PRN
  Administered 2011-10-26: 80 ug via INTRAVENOUS
  Administered 2011-10-26: 120 ug via INTRAVENOUS
  Administered 2011-10-26: 80 ug via INTRAVENOUS
  Administered 2011-10-26 (×2): 120 ug via INTRAVENOUS
  Administered 2011-10-26: 80 ug via INTRAVENOUS

## 2011-10-26 MED ORDER — LACTATED RINGERS IV SOLN
INTRAVENOUS | Status: DC | PRN
  Administered 2011-10-26: 18:00:00 via INTRAVENOUS

## 2011-10-26 MED ORDER — ONDANSETRON HCL 4 MG/2ML IJ SOLN: 4.0000 mg | Freq: Once | INTRAMUSCULAR | Status: AC | PRN

## 2011-10-26 MED ORDER — WARFARIN VIDEO
Freq: Once | Status: AC
  Administered 2011-10-27: 14:00:00

## 2011-10-26 MED ORDER — METOCLOPRAMIDE HCL 5 MG/ML IJ SOLN
5.0000 mg | Freq: Three times a day (TID) | INTRAMUSCULAR | Status: DC | PRN
  Filled 2011-10-26: qty 2

## 2011-10-26 MED ORDER — 0.9 % SODIUM CHLORIDE (POUR BTL) OPTIME
TOPICAL | Status: DC | PRN
  Administered 2011-10-26: 1000 mL

## 2011-10-26 MED ORDER — OXYCODONE HCL 5 MG PO TABS
5.0000 mg | ORAL_TABLET | ORAL | Status: DC | PRN
  Administered 2011-10-27 – 2011-10-29 (×8): 5 mg via ORAL
  Administered 2011-10-30 – 2011-11-01 (×10): 10 mg via ORAL
  Filled 2011-10-26 (×3): qty 2
  Filled 2011-10-26: qty 1
  Filled 2011-10-26: qty 2
  Filled 2011-10-26: qty 1
  Filled 2011-10-26: qty 2
  Filled 2011-10-26: qty 1
  Filled 2011-10-26 (×4): qty 2
  Filled 2011-10-26: qty 1
  Filled 2011-10-26: qty 2
  Filled 2011-10-26: qty 1
  Filled 2011-10-26: qty 2
  Filled 2011-10-26 (×2): qty 1

## 2011-10-26 MED ORDER — ACETAMINOPHEN 650 MG RE SUPP
650.0000 mg | RECTAL | Status: DC | PRN
  Administered 2011-10-26: 650 mg via RECTAL
  Filled 2011-10-26: qty 1

## 2011-10-26 MED ORDER — METOCLOPRAMIDE HCL 5 MG PO TABS
5.0000 mg | ORAL_TABLET | Freq: Three times a day (TID) | ORAL | Status: DC | PRN
  Filled 2011-10-26: qty 2

## 2011-10-26 MED ORDER — MENTHOL 3 MG MT LOZG: 1.0000 | LOZENGE | OROMUCOSAL | Status: DC | PRN

## 2011-10-26 MED ORDER — ACETAMINOPHEN 325 MG PO TABS: 650.0000 mg | ORAL_TABLET | Freq: Four times a day (QID) | ORAL | Status: DC | PRN

## 2011-10-26 MED ORDER — FENTANYL CITRATE 0.05 MG/ML IJ SOLN
INTRAMUSCULAR | Status: DC | PRN
  Administered 2011-10-26: 150 ug via INTRAVENOUS
  Administered 2011-10-26 (×2): 50 ug via INTRAVENOUS

## 2011-10-26 MED ORDER — PATIENT'S GUIDE TO USING COUMADIN BOOK
Freq: Once | Status: DC
  Filled 2011-10-26: qty 1

## 2011-10-26 MED ORDER — INSULIN GLARGINE 100 UNIT/ML ~~LOC~~ SOLN
10.0000 [IU] | Freq: Every day | SUBCUTANEOUS | Status: DC
  Administered 2011-10-27 – 2011-10-28 (×3): 10 [IU] via SUBCUTANEOUS
  Filled 2011-10-26 (×2): qty 3

## 2011-10-26 MED ORDER — HYDROMORPHONE HCL PF 1 MG/ML IJ SOLN
0.2500 mg | INTRAMUSCULAR | Status: DC | PRN
  Administered 2011-10-26 (×2): 0.5 mg via INTRAVENOUS

## 2011-10-26 MED ORDER — POTASSIUM CHLORIDE IN NACL 20-0.9 MEQ/L-% IV SOLN
INTRAVENOUS | Status: DC
  Administered 2011-10-27 – 2011-10-28 (×3): via INTRAVENOUS
  Administered 2011-10-30: 1000 mL via INTRAVENOUS
  Administered 2011-11-02: 06:00:00 via INTRAVENOUS
  Filled 2011-10-26 (×14): qty 1000

## 2011-10-26 MED ORDER — PROPOFOL 10 MG/ML IV EMUL
INTRAVENOUS | Status: DC | PRN
  Administered 2011-10-26: 170 mg via INTRAVENOUS

## 2011-10-26 MED ORDER — SODIUM CHLORIDE 0.9 % IV SOLN
INTRAVENOUS | Status: DC | PRN
  Administered 2011-10-26: 17:00:00 via INTRAVENOUS

## 2011-10-26 MED ORDER — MORPHINE SULFATE 2 MG/ML IJ SOLN
1.0000 mg | INTRAMUSCULAR | Status: DC | PRN
  Administered 2011-10-27 – 2011-10-31 (×13): 1 mg via INTRAVENOUS
  Filled 2011-10-26 (×15): qty 1

## 2011-10-26 MED ORDER — ACETAMINOPHEN 40 MG HALF SUPP: 40.0000 mg | RECTAL | Status: DC | PRN

## 2011-10-26 MED ORDER — THROMBIN 20000 UNITS EX KIT
PACK | CUTANEOUS | Status: DC | PRN
  Administered 2011-10-26: 20000 [IU] via TOPICAL

## 2011-10-26 MED ORDER — INSULIN ASPART 100 UNIT/ML ~~LOC~~ SOLN
0.0000 [IU] | SUBCUTANEOUS | Status: DC
  Administered 2011-10-26 – 2011-10-27 (×2): 2 [IU] via SUBCUTANEOUS
  Administered 2011-10-27 (×3): 1 [IU] via SUBCUTANEOUS
  Administered 2011-10-27: 2 [IU] via SUBCUTANEOUS
  Filled 2011-10-26 (×2): qty 3

## 2011-10-26 MED ORDER — OXYCODONE HCL 5 MG PO TABS: 5.0000 mg | ORAL_TABLET | ORAL | Status: DC | PRN

## 2011-10-26 MED ORDER — VANCOMYCIN HCL 1000 MG IV SOLR
1250.0000 mg | Freq: Two times a day (BID) | INTRAVENOUS | Status: DC
  Administered 2011-10-26: 1250 mg via INTRAVENOUS
  Filled 2011-10-26 (×3): qty 1250

## 2011-10-26 MED ORDER — EPHEDRINE SULFATE 50 MG/ML IJ SOLN
INTRAMUSCULAR | Status: DC | PRN
  Administered 2011-10-26: 10 mg via INTRAVENOUS

## 2011-10-26 MED ORDER — PHENOL 1.4 % MT LIQD: 1.0000 | OROMUCOSAL | Status: DC | PRN

## 2011-10-26 MED ORDER — VANCOMYCIN HCL 1000 MG IV SOLR
1750.0000 mg | INTRAVENOUS | Status: DC
  Administered 2011-10-27 – 2011-10-28 (×2): 1750 mg via INTRAVENOUS
  Filled 2011-10-26 (×3): qty 1750

## 2011-10-26 SURGICAL SUPPLY — 82 items
BANDAGE ELASTIC 4 VELCRO ST LF (GAUZE/BANDAGES/DRESSINGS) IMPLANT
BANDAGE ELASTIC 6 VELCRO ST LF (GAUZE/BANDAGES/DRESSINGS) ×4 IMPLANT
BANDAGE ESMARK 6X9 LF (GAUZE/BANDAGES/DRESSINGS) ×1 IMPLANT
BLADE SAW SGTL 13.0X1.19X90.0M (BLADE) IMPLANT
BLADE SURG 10 STRL SS (BLADE) ×12 IMPLANT
BNDG CMPR 9X6 STRL LF SNTH (GAUZE/BANDAGES/DRESSINGS) ×1
BNDG CMPR MED 10X6 ELC LF (GAUZE/BANDAGES/DRESSINGS) ×1
BNDG COHESIVE 6X5 TAN STRL LF (GAUZE/BANDAGES/DRESSINGS) ×2 IMPLANT
BNDG ELASTIC 6X10 VLCR STRL LF (GAUZE/BANDAGES/DRESSINGS) ×2 IMPLANT
BNDG ESMARK 6X9 LF (GAUZE/BANDAGES/DRESSINGS) ×2
BOWL SMART MIX CTS (DISPOSABLE) ×2 IMPLANT
CHLORAPREP W/TINT 26ML (MISCELLANEOUS) ×2 IMPLANT
CLOTH BEACON ORANGE TIMEOUT ST (SAFETY) ×2 IMPLANT
COVER BACK TABLE 24X17X13 BIG (DRAPES) IMPLANT
COVER SURGICAL LIGHT HANDLE (MISCELLANEOUS) ×2 IMPLANT
CUFF TOURNIQUET SINGLE 34IN LL (TOURNIQUET CUFF) ×2 IMPLANT
CUFF TOURNIQUET SINGLE 44IN (TOURNIQUET CUFF) IMPLANT
DRAPE INCISE IOBAN 66X45 STRL (DRAPES) ×2 IMPLANT
DRAPE ORTHO SPLIT 77X108 STRL (DRAPES) ×3
DRAPE PROXIMA HALF (DRAPES) ×2 IMPLANT
DRAPE SURG ORHT 6 SPLT 77X108 (DRAPES) ×3 IMPLANT
DRAPE U-SHAPE 47X51 STRL (DRAPES) ×2 IMPLANT
DRAPE X-RAY CASS 24X20 (DRAPES) IMPLANT
DRSG PAD ABDOMINAL 8X10 ST (GAUZE/BANDAGES/DRESSINGS) ×2 IMPLANT
DURAPREP 26ML APPLICATOR (WOUND CARE) IMPLANT
ELECT REM PT RETURN 9FT ADLT (ELECTROSURGICAL) ×2
ELECTRODE REM PT RTRN 9FT ADLT (ELECTROSURGICAL) ×1 IMPLANT
EVACUATOR 1/8 PVC DRAIN (DRAIN) ×2 IMPLANT
FACESHIELD LNG OPTICON STERILE (SAFETY) ×2 IMPLANT
FLUID NSS /IRRIG 3000 ML XXX (IV SOLUTION) ×4 IMPLANT
GAUZE XEROFORM 1X8 LF (GAUZE/BANDAGES/DRESSINGS) ×2 IMPLANT
GAUZE XEROFORM 5X9 LF (GAUZE/BANDAGES/DRESSINGS) ×2 IMPLANT
GLOVE BIO SURGEON ST LM GN SZ9 (GLOVE) IMPLANT
GLOVE BIOGEL PI IND STRL 8 (GLOVE) ×1 IMPLANT
GLOVE BIOGEL PI INDICATOR 8 (GLOVE) ×1
GLOVE SURG ORTHO 8.0 STRL STRW (GLOVE) ×2 IMPLANT
GOWN PREVENTION PLUS LG XLONG (DISPOSABLE) IMPLANT
GOWN PREVENTION PLUS XLARGE (GOWN DISPOSABLE) ×6 IMPLANT
GOWN STRL NON-REIN LRG LVL3 (GOWN DISPOSABLE) ×2 IMPLANT
HANDPIECE INTERPULSE COAX TIP (DISPOSABLE) ×2
HOOD PEEL AWAY FACE SHEILD DIS (HOOD) IMPLANT
IMMOBILIZER KNEE 20 (SOFTGOODS)
IMMOBILIZER KNEE 20 THIGH 36 (SOFTGOODS) IMPLANT
IMMOBILIZER KNEE 22 UNIV (SOFTGOODS) ×2 IMPLANT
IMMOBILIZER KNEE 24 THIGH 36 (MISCELLANEOUS) IMPLANT
IMMOBILIZER KNEE 24 UNIV (MISCELLANEOUS)
INSERT TIBIAL TC3 SZ 4 12.5 (Knees) ×2 IMPLANT
KIT BASIN OR (CUSTOM PROCEDURE TRAY) ×2 IMPLANT
KIT ROOM TURNOVER OR (KITS) ×2 IMPLANT
KIT STIMULAN RAPID CURE  10CC (Orthopedic Implant) ×1 IMPLANT
KIT STIMULAN RAPID CURE 10CC (Orthopedic Implant) ×1 IMPLANT
MANIFOLD NEPTUNE II (INSTRUMENTS) ×2 IMPLANT
NEEDLE 18GX1X1/2 (RX/OR ONLY) (NEEDLE) IMPLANT
NEEDLE SPNL 18GX3.5 QUINCKE PK (NEEDLE) IMPLANT
NS IRRIG 1000ML POUR BTL (IV SOLUTION) ×2 IMPLANT
PACK TOTAL JOINT (CUSTOM PROCEDURE TRAY) ×2 IMPLANT
PAD ARMBOARD 7.5X6 YLW CONV (MISCELLANEOUS) ×4 IMPLANT
PAD CAST 4YDX4 CTTN HI CHSV (CAST SUPPLIES) ×1 IMPLANT
PADDING CAST COTTON 4X4 STRL (CAST SUPPLIES) ×2
PADDING CAST COTTON 6X4 STRL (CAST SUPPLIES) ×6 IMPLANT
PADDING WEBRIL 4 STERILE (GAUZE/BANDAGES/DRESSINGS) ×2 IMPLANT
RUBBERBAND STERILE (MISCELLANEOUS) IMPLANT
SET HNDPC FAN SPRY TIP SCT (DISPOSABLE) ×1 IMPLANT
SPONGE GAUZE 4X4 12PLY (GAUZE/BANDAGES/DRESSINGS) ×4 IMPLANT
SPONGE LAP 18X18 X RAY DECT (DISPOSABLE) ×2 IMPLANT
STAPLER VISISTAT 35W (STAPLE) ×2 IMPLANT
SUCTION FRAZIER TIP 10 FR DISP (SUCTIONS) ×2 IMPLANT
SUT ETHILON 3 0 PS 1 (SUTURE) ×4 IMPLANT
SUT VIC AB 0 CTB1 27 (SUTURE) ×6 IMPLANT
SUT VIC AB 1 CT1 27 (SUTURE) ×5
SUT VIC AB 1 CT1 27XBRD ANBCTR (SUTURE) ×5 IMPLANT
SUT VIC AB 2-0 CT1 27 (SUTURE) ×4
SUT VIC AB 2-0 CT1 TAPERPNT 27 (SUTURE) ×2 IMPLANT
SWAB COLLECTION DEVICE MRSA (MISCELLANEOUS) ×2 IMPLANT
SYR 30ML SLIP (SYRINGE) IMPLANT
SYR TB 1ML LUER SLIP (SYRINGE) IMPLANT
TOWEL OR 17X24 6PK STRL BLUE (TOWEL DISPOSABLE) ×2 IMPLANT
TOWEL OR 17X26 10 PK STRL BLUE (TOWEL DISPOSABLE) ×8 IMPLANT
TRAY FOLEY CATH 14FR (SET/KITS/TRAYS/PACK) IMPLANT
TUBE ANAEROBIC SPECIMEN COL (MISCELLANEOUS) ×2 IMPLANT
WATER STERILE IRR 1000ML POUR (IV SOLUTION) IMPLANT
YANKAUER SUCT BULB TIP NO VENT (SUCTIONS) ×2 IMPLANT

## 2011-10-26 NOTE — Brief Op Note (Signed)
10/26/2011  8:08 PM  PATIENT:  Catherine [name]  77 y.o. female  PRE-OPERATIVE DIAGNOSIS:  infected right total knee  POST-OPERATIVE DIAGNOSIS:  infected right total knee  PROCEDURE:  Procedure(s): IRRIGATION AND DEBRIDEMENT KNEE WITH POLY EXCHANGE  SURGEON:  Surgeon(s): Cammy Copa, MD Kathryne Hitch, MD  ASSISTANT:  ANESTHESIA:   general  EBL: 100 ml    Total I/O In: 900 [I.V.:900] Out: 200 [Urine:200]  BLOOD ADMINISTERED: none  DRAINS: none   LOCAL MEDICATIONS USED:  none  SPECIMEN:  cxs x 1  COUNTS:  YES  TOURNIQUET:  * Missing tourniquet times found for documented tourniquets in log:  27905 *  DICTATION: .Other Dictation: Dictation Number [id number]  PLAN OF CARE: Admit to inpatient   PATIENT DISPOSITION:  PACU - hemodynamically stable

## 2011-10-26 NOTE — ED Provider Notes (Signed)
Chief complaint: Right knee pain  HPI (MSE): The patient was seen on arrival. This is a 77 year old white female who was sent from [facility] [address] to the care of Allie Bossier, excepting orthopedic physician. The patient has a long-standing history of problems with her right knee and presented to [facility] of increased pain and weakness of the right knee. An arthrocentesis was performed on Monday and she was transferred here as noted above. EMS reports patient stable en route although somewhat confused.  PE:  General: Elderly white female in no acute distress Heart: Tachycardic Respirations: Normal respiratory effort and excursion Abdomen: Obese, soft Extremities: Erythema and tenderness of right knee with erythema of the right lower leg about the ankle  Diagnosis: Right knee pain Disposition admission to the service of Dr. Magnus Ivan.   Hanley Seamen, MD 10/26/11 [phone]

## 2011-10-26 NOTE — H&P (Signed)
Catherine [name] is an 77 y.o. female.   Chief Complaint:   Right knee pain HPI:   77 yo female who has a 24-48 hour history of right leg redness and knee pain.  She states that this only started over the last day or two and that she did not have prior problems.  She went to [facility] last night and her knee joint was aspirated.  According to the ER physician there, the aspirate showed 5,000+ WBC and gram positive cocci.  She was then sent here to [facility] for definitive treatment.  PMHx: previous right total knee revision due to infection, Type II DM, hypothyroid, tobacco abuse, hypercholesterolemia, sleep apneia, HTN, GERD  No past surgical history on file.  No family history on file. Social History:  does not have a smoking history on file. She does not have any smokeless tobacco history on file. Her alcohol and drug histories not on file.  Allergies:  Allergies  Allergen Reactions  . Gabapentin     REACTION: rash, diarrhea    No current facility-administered medications on file as of 10/26/2011.   No current outpatient prescriptions on file as of 10/26/2011.    No results found for this or any previous visit (from the past 48 hour(s)). No results found.  Review of Systems  Musculoskeletal: Positive for joint pain.  All other systems reviewed and are negative.    Blood pressure 105/47, pulse 99, temperature 100 F (37.8 C), temperature source Oral, resp. rate 24, SpO2 96.00%. Physical Exam  Constitutional: She is oriented to person, place, and time. She appears well-developed and well-nourished.  HENT:  Head: Normocephalic and atraumatic.  Eyes: EOM are normal. Pupils are equal, round, and reactive to light.  Neck: Normal range of motion. Neck supple.  Cardiovascular: Normal rate and regular rhythm.   Respiratory: Effort normal and breath sounds normal.  GI: Soft. Bowel sounds are normal.  Musculoskeletal:       Right knee: She exhibits decreased range of motion,  swelling and effusion. tenderness found.  Neurological: She is alert and oriented to person, place, and time.  Skin: Skin is warm and dry.  Psychiatric: She has a normal mood and affect.   Cellulitis right shin, warm  Assessment/Plan Right total knee prosthetic joint infection 1) start IV antibiotics (Vancomycin) 2) to the OR this evening for open I&D, synovectomy, poly exchange in an attempt to salvage the prosthesis.  Kathryne Hitch 10/26/2011, 6:48 AM

## 2011-10-26 NOTE — Consult Note (Signed)
Catherine [name] [facility] CONSULT NOTE  77 year old Caucasian female with known history of type 2 diabetes mellitus, hypothyroidism, hypercholesterolemia, obstructive sleep apnea, hypertension, status post Gill procedure of lower back L5-S1 with posterior lateral fusion and laminectomy at the right total knee arthroplasty 12/30/08 with subsequent infection and the extraction 2/25 2011 and subsequent reimplantation of right total knee spacer 12/22/09 presented herself to her primary care physician's office as she had swelling of the right knee that looks bigger than prior associated with significant pain. She ultimately started to experience cold shows an couldn't get warm at around 8 PM. She went over to the emergency room at [facility] and seemed a little less compared to her son. She did not really complain to him of any cough/cold/dysuria or other issues just complained of knee pain. She was admitted under the orthopedic service because of these issues and thought that this likely his infected prosthesis-given the aspirate showed 5000+ WBCs and gram-positive cocci. History was provided by her son at phone [phone] and from retrospective review of chart as patient is not really able to give me too much history.   Patient she's not feeling too well states she's been up all night. Denies any chest pain, any shortness of breath, any blurred vision, any double double vision, any weakness in any one side of body any nausea any vomiting any dark stool currently (patient has a hemorrhoid that sometimes troubles her)  Recommendations  Patient Active Problem List  Diagnoses  .  Infection of total right knee replacement-patient for tentative surgery under Dr. Dorene Grebe a little later today-she had a fever as high as 100 earlier today and was given Tylenol. V her preoperative EKG showed sinus tachycardia with a PR interval of 0.12 QRS axis of 45 no ST-T wave elevations, possible right bundle branch  block given her relative anemia in the past and also some mild shortness of breath a limited workup has been done today including the following -Chest x-ray 1 view -CBC with differential -CMET Was noted that off of oxygen patient dropped to O2 sats of 88% in the room and we will rule out a pneumonia given the fact that she has some crackles in the right chest. I have cut back on her IV fluids to 50 cc per hour until she has surgery-if her chest x-ray shows fluid overload she likely will need to be off of IV fluids prior to surgery She is apparently on Lasix 40 mg by mouth daily   . Tachycardia-patient had a sinus tachycardia nonsustained eating 20 and 105-125 range. She also came in slightly hypotensive, which raises possible sepsis syndrome likely related to her infected knee. Given also the fact that she has had fever I think he would be prudent to get blood cultures x2. She could also have rebound tachycardia from being off her beta blocker, as I do not see that it has been given. We will reimplement   . HYPERCHOLESTEROLEMIA-continue atorvastatin 40 mg as an outpatient   . TOBACCO ABUSE-we'll reassess need for nicotine patch. She does have an oxygen requirement at present time and I'm not sure if this more related to her obstructive sleep apnea versus possibility of COPD   . OBSTRUCTIVE SLEEP APNEA-has been evaluated in the past for CPAP. Patient apparently does not really use this and says she could not wear this and apparently returned her machine in April 2011.  Catherine [name] SLEEP RELATED HYPOVENTILATION/HYPOXEMIA CCE-see above   . HYPERTENSION-takes metoprolol XL 25 mg twice  a day??-Patient likely will need her beta blocker which will be reimplemented with a sip of water now.   Catherine [name] GERD-currently not on any medications for this. Continue calcium citrate which has some protection   . URINARY INCONTINENCE  . HYPOTHYROIDISM-currently not on Synthroid. Outpatient reevaluation needed   . Anemia-see above.    Catherine [name] DIABETES MELLITUS, TYPE II, WITH NEUROLOGICAL COMPLICATIONS-patient takes metformin 1000 g twice a day, 40 units of Lantus each bedtime and Lyrica 100 mg twice a day for neuropathic pain. We will lower the dose of Lantus to 10 units qhs and start q4 sliding scale coverage       Lekeshia Kram,JAI 10/26/2011, 10:15 AM Patient seems to be at baseline. She does not use her CPAP and this should be discussed with her in detail. I did update her sons let him know that I think she may be stable for surgery from a general standpoint however further labs are pending at this time including a chest x-ray. Once results of those things to return we may be able to make a better decision with regards to this.  Medications: Scheduled Meds:   . acetaminophen  500 mg Oral Once  . vancomycin  1,250 mg Intravenous Q12H   Continuous Infusions:   . sodium chloride 75 mL/hr at 10/26/11 0956   PRN Meds:.morphine injection, oxyCODONE  Objective: Weight change:  No intake or output data in the 24 hours ending 10/26/11 1015   HEENT alert, but slightly disoriented obese Caucasian female CHEST crackles in the right lower chest no tactile vocal resonance no tactile from CARDS S1-S2 no murmur rub or gallop, tachycardic but regular rate and rhythm no JVD distention, carotid bruit noted ABD soft nontender nondistended umbilical hernia incentive abdomen no rebound no guarding NEURO grossly intact moving all 4 limbs equally however not really able to bend the right knee. Strength seems grossly intact SKIN distended area in the suprapatellar pouch and overall for the right knee. Scar noted along the top of the patella and thigh about 30 cm long. Lower symptoms do not have any gross edema  Lab Results:  None found   Studies/Results: No results found.

## 2011-10-26 NOTE — ED Notes (Signed)
Per [facility], Increased pain and weakness to right leg so son brought pt to the ED. BUN and Creatnine slightly elevated. CBG 204. Pt has intermittent confusion. Dilaudid was given at [facility]. Aspirated knee and sent to lab. Showed minimal R blood cells and WBC detected. +2 edema at knee and warm to touch. 22g L wrist IV.

## 2011-10-26 NOTE — Anesthesia Procedure Notes (Signed)
Procedure Name: Intubation Date/Time: 10/26/2011 5:36 PM Performed by: Shirlyn Goltz, TOM Pre-anesthesia Checklist: Patient identified, Emergency Drugs available, Suction available, Patient being monitored and Timeout performed Patient Re-evaluated:Patient Re-evaluated prior to inductionOxygen Delivery Method: Circle system utilized Preoxygenation: Pre-oxygenation with 100% oxygen Intubation Type: IV induction and Cricoid Pressure applied Laryngoscope Size: Mac and 4 Grade View: Grade I Tube type: Oral Tube size: 7.5 mm Number of attempts: 1 Airway Equipment and Method: Stylet Secured at: 22 cm Tube secured with: Tape Dental Injury: Teeth and Oropharynx as per pre-operative assessment

## 2011-10-26 NOTE — Progress Notes (Signed)
ANTICOAGULATION CONSULT NOTE - Initial Consult  Pharmacy Consult for coumadin Indication: VTE prophylaxis  Allergies  Allergen Reactions  . Gabapentin     REACTION: rash, diarrhea    Patient Measurements: Height: 5\' 6"  (167.6 cm) Weight: 285 lb 4.4 oz (129.4 kg) IBW/kg (Calculated) : 59.3  Heparin Dosing Weight:   Vital Signs: Temp: 100.7 F (38.2 C) (03/06 2200) Temp src: Oral (03/06 2200) BP: 97/51 mmHg (03/06 2200) Pulse Rate: 123  (03/06 2200)  Labs:  Basename 10/26/11 1015  HGB 11.6*  HCT 33.9*  PLT 131*  APTT --  LABPROT --  INR --  HEPARINUNFRC --  CREATININE 1.56*  CKTOTAL --  CKMB --  TROPONINI --   Estimated Creatinine Clearance: 43.6 ml/min (by C-G formula based on Cr of 1.56).  Medical History: Past Medical History  Diagnosis Date  . Sleep apnea   . Hypertension   . Hypothyroidism   . GERD (gastroesophageal reflux disease)   . Arthritis   . Anxiety   . Diabetes mellitus     Medications:  Prescriptions prior to admission  Medication Sig Dispense Refill  . acetaminophen (TYLENOL) 325 MG tablet Take 650 mg by mouth every 6 (six) hours as needed. For pain      . atorvastatin (LIPITOR) 40 MG tablet Take 40 mg by mouth every evening.      . calcium citrate (CALCITRATE - DOSED IN MG ELEMENTAL CALCIUM) 950 MG tablet Take 1 tablet by mouth daily.      . cholecalciferol (VITAMIN D) 400 UNITS TABS Take 400 Units by mouth 2 (two) times daily.      . fenofibrate 160 MG tablet Take 160 mg by mouth daily.      . furosemide (LASIX) 20 MG tablet Take 40 mg by mouth daily.      . insulin glargine (LANTUS) 100 UNIT/ML injection Inject 40 Units into the skin at bedtime.      . metFORMIN (GLUCOPHAGE) 1000 MG tablet Take 1,000 mg by mouth 2 (two) times daily with a meal.      . metoprolol succinate (TOPROL-XL) 50 MG 24 hr tablet Take 25 mg by mouth 2 (two) times daily. Take with or immediately following a meal.      . omega-3 acid ethyl esters (LOVAZA) 1 G  capsule Take 1 g by mouth 2 (two) times daily.      . pregabalin (LYRICA) 100 MG capsule Take 100 mg by mouth 2 (two) times daily.        Assessment: 77 yo F s/p I&D of R Knee to start coumadin post-op for VTE px. Coumadin score = 3 Goal of Therapy:  INR 2-3   Plan:  Coumadin 5 mg po x 1 dose tonight. Coumadin book Coumadin video Daily protime/INR Herby Abraham 10/26/2011 10:55 PM   Len Childs T 10/26/2011,10:53 PM

## 2011-10-26 NOTE — ED Notes (Signed)
Received pt. From [facility] via stretcher by [facility], pt. Alert and oriented,

## 2011-10-26 NOTE — ED Notes (Signed)
Bp 107/54, hr 106, 98% on  3L O2 N/C. Expiratory wheezing noted by [facility],

## 2011-10-26 NOTE — Progress Notes (Signed)
I just talked with Roy [place] who is Atlee a Leger's son. She asked that I call him before the procedure. I explained to him the seriousness of a recurrent infection in the knee. He was not interested in any explanations and was exceedingly rude on the phone. I tried to explain to him that it was not entirely clear where the infection was coming from-- that it was either coming from the cellulitis that she has from venous stasis insufficiency in the right leg or potentially recurrent infection from her revision 2 years ago or potentially just a new source of infection which came up as a result of her debilitated state. He is not interested in any of these explanations. I also attempted to explain the risk of failure to eradicate the infection with a washout and antibiotic bead placement. Again he is not interested in any of this discussion. His interest were primarily "find out where the infection is coming from and fix it" he repeated this and on hung up. This was not a productive exchange of information.

## 2011-10-26 NOTE — Anesthesia Postprocedure Evaluation (Signed)
  Anesthesia Post-op Note  Patient: Catherine [name]  Procedure(s) Performed: Procedure(s) (LRB): IRRIGATION AND DEBRIDEMENT KNEE WITH POLY EXCHANGE (Right)  Patient Location: PACU  Anesthesia Type: General  Level of Consciousness: awake, sedated, patient cooperative and lethargic  Airway and Oxygen Therapy: Patient Spontanous Breathing and Patient connected to face mask oxygen  Post-op Pain: moderate  Post-op Assessment: Post-op Vital signs reviewed, Patient's Cardiovascular Status Stable, RESPIRATORY FUNCTION UNSTABLE, Patent Airway, No signs of Nausea or vomiting and Pain level controlled  Post-op Vital Signs: stable  Complications: No apparent anesthesia complications

## 2011-10-26 NOTE — Preoperative (Signed)
Beta Blockers   Reason not to administer Beta Blockers:Not Applicable 

## 2011-10-26 NOTE — Progress Notes (Addendum)
ANTIBIOTIC CONSULT NOTE - INITIAL  Pharmacy Consult for Vancomycin Indication: Right prosthetic knee joint infection  Allergies  Allergen Reactions  . Gabapentin     REACTION: rash, diarrhea    Patient Measurements:   Adjusted Body Weight: 122 kg  Vital Signs: Temp: 98.7 F (37.1 C) (03/06 4098) Temp src: Oral (03/06 0403) BP: 127/64 mmHg (03/06 0658) Pulse Rate: 105  (03/06 0658) Intake/Output from previous day:   Intake/Output from this shift:    Labs: No results found for this basename: WBC:3,HGB:3,PLT:3,LABCREA:3,CREATININE:3 in the last 72 hours The CrCl is unknown because both a height and weight (above a minimum accepted value) are required for this calculation. No results found for this basename: VANCOTROUGH:2,VANCOPEAK:2,VANCORANDOM:2,GENTTROUGH:2,GENTPEAK:2,GENTRANDOM:2,TOBRATROUGH:2,TOBRAPEAK:2,TOBRARND:2,AMIKACINPEAK:2,AMIKACINTROU:2,AMIKACIN:2, in the last 72 hours   Microbiology: No results found for this or any previous visit (from the past 720 hour(s)).  Medical History: No past medical history on file.  Medications: Med rec pending  Assessment: Right knee pain and leg redness x 24-48h. Knee joint aspirated at [facility] with WBC and GPC.  PMH: Right TKR due to infection, DM, hypothyroid, +tobacco, HLD, OSA, HTN, GERD.  ID: Vanco for GPC in right knee aspirate. Tmax 100. Awaiting CBC and BMET this am.  Goal of Therapy: Vanco trough 15-20 with recurrent infection.  Plan:  Vancomycin 1250mg  IV q12h. F/u BMET/CBC. Check Vancomycin trough after 3-5 doses at steady state.  Merilynn Finland, Crystal Stillinger 10/26/2011,9:22 AM  Addendum: Scr reported as 1.56 leading to CrCl~35 ml/min.  Will change vancomycin dosing to 1750 mg IV q24h.  Will give additional 500 mg IV x1 now.  Walker Paddack L. Illene Bolus, PharmD, BCPS Clinical Pharmacist Pager: [phone] 10/26/2011 1:38 PM

## 2011-10-26 NOTE — Progress Notes (Signed)
Patient's lab reviewed  Will dose tylenol Rectal for fever  Patient has some renal insufficiency however does also have some evidence on exam of fluid overload hence we will hold off on any IV fluids and keep on sliding scale insulin-will place on IV lasix one dose 40 mg.   Subsequent to surgery patient may benefit from holding off metformin and will be reevaluated by [facility] hospitalist at that time. I believe that she is stable for surgery at this likely needs to be done sooner rather than later, and do not feel at this point that there is any need for further cardiovascular workup.  Pleas Koch, MD [facility] [phone]

## 2011-10-26 NOTE — Transfer of Care (Signed)
Immediate Anesthesia Transfer of Care Note  Patient: Catherine [name]  Procedure(s) Performed: Procedure(s) (LRB): IRRIGATION AND DEBRIDEMENT KNEE WITH POLY EXCHANGE (Right)  Patient Location: PACU  Anesthesia Type: General  Level of Consciousness: patient cooperative  Airway & Oxygen Therapy: Patient Spontanous Breathing and Patient connected to face mask oxygen  Post-op Assessment: Report given to PACU RN, Post -op Vital signs reviewed and stable and Patient moving all extremities X 4  Post vital signs: Reviewed and stable  Complications: No apparent anesthesia complications

## 2011-10-26 NOTE — Anesthesia Preprocedure Evaluation (Signed)
Anesthesia Evaluation  Patient identified by MRN, date of birth, ID band Patient awake    Reviewed: Allergy & Precautions, H&P , NPO status , Patient's Chart, lab work & pertinent test results  Airway Mallampati: III TM Distance: >3 FB Neck ROM: Full    Dental  (+) Dental Advisory Given   Pulmonary sleep apnea , former smoker OSA, Does not tolerate CPAP         Cardiovascular hypertension, Pt. on medications and Pt. on home beta blockers     Neuro/Psych PSYCHIATRIC DISORDERS Anxiety Depression    GI/Hepatic GERD-  Controlled,  Endo/Other  Diabetes mellitus-, Type 2, Insulin DependentHypothyroidism Morbid obesity  Renal/GU      Musculoskeletal   Abdominal   Peds  Hematology   Anesthesia Other Findings   Reproductive/Obstetrics                           Anesthesia Physical Anesthesia Plan  ASA: III  Anesthesia Plan: General   Post-op Pain Management:    Induction: Intravenous, Rapid sequence and Cricoid pressure planned  Airway Management Planned: Oral ETT  Additional Equipment:   Intra-op Plan:   Post-operative Plan: Extubation in OR and Possible Post-op intubation/ventilation  Informed Consent: I have reviewed the patients History and Physical, chart, labs and discussed the procedure including the risks, benefits and alternatives for the proposed anesthesia with the patient or authorized representative who has indicated his/her understanding and acceptance.   Dental advisory given  Plan Discussed with: CRNA, Anesthesiologist and Surgeon  Anesthesia Plan Comments:         Anesthesia Quick Evaluation

## 2011-10-27 DIAGNOSIS — Y831 Surgical operation with implant of artificial internal device as the cause of abnormal reaction of the patient, or of later complication, without mention of misadventure at the time of the procedure: Secondary | ICD-10-CM

## 2011-10-27 DIAGNOSIS — T8450XA Infection and inflammatory reaction due to unspecified internal joint prosthesis, initial encounter: Secondary | ICD-10-CM

## 2011-10-27 DIAGNOSIS — I517 Cardiomegaly: Secondary | ICD-10-CM

## 2011-10-27 LAB — DIFFERENTIAL
Basophils Absolute: 0 10*3/uL (ref 0.0–0.1)
Eosinophils Relative: 0 % (ref 0–5)
Lymphocytes Relative: 7 % — ABNORMAL LOW (ref 12–46)
Lymphs Abs: 0.6 10*3/uL — ABNORMAL LOW (ref 0.7–4.0)
Neutro Abs: 7.9 10*3/uL — ABNORMAL HIGH (ref 1.7–7.7)
Neutrophils Relative %: 87 % — ABNORMAL HIGH (ref 43–77)

## 2011-10-27 LAB — CBC
Hemoglobin: 10.9 g/dL — ABNORMAL LOW (ref 12.0–15.0)
MCH: 30.9 pg (ref 26.0–34.0)
MCHC: 33.5 g/dL (ref 30.0–36.0)
MCV: 92.1 fL (ref 78.0–100.0)
RBC: 3.53 MIL/uL — ABNORMAL LOW (ref 3.87–5.11)

## 2011-10-27 LAB — BASIC METABOLIC PANEL
BUN: 39 mg/dL — ABNORMAL HIGH (ref 6–23)
CO2: 27 mEq/L (ref 19–32)
Calcium: 9 mg/dL (ref 8.4–10.5)
Creatinine, Ser: 1.78 mg/dL — ABNORMAL HIGH (ref 0.50–1.10)
GFR calc non Af Amer: 27 mL/min — ABNORMAL LOW (ref >90)
Glucose, Bld: 191 mg/dL — ABNORMAL HIGH (ref 70–99)
Sodium: 142 mEq/L (ref 135–145)

## 2011-10-27 LAB — GLUCOSE, CAPILLARY
Glucose-Capillary: 161 mg/dL — ABNORMAL HIGH (ref 70–99)
Glucose-Capillary: 146 mg/dL — ABNORMAL HIGH (ref 70–99)
Glucose-Capillary: 159 mg/dL — ABNORMAL HIGH (ref 70–99)
Glucose-Capillary: 124 mg/dL — ABNORMAL HIGH (ref 70–99)

## 2011-10-27 LAB — PROTIME-INR
INR: 1.33 (ref 0.00–1.49)
Prothrombin Time: 16.7 seconds — ABNORMAL HIGH (ref 11.6–15.2)

## 2011-10-27 MED ORDER — ACETAMINOPHEN 325 MG RE SUPP
325.0000 mg | RECTAL | Status: AC
  Administered 2011-10-27: 325 mg via RECTAL
  Filled 2011-10-27: qty 1

## 2011-10-27 MED ORDER — WARFARIN SODIUM 7.5 MG PO TABS
7.5000 mg | ORAL_TABLET | Freq: Once | ORAL | Status: AC
  Filled 2011-10-27: qty 1

## 2011-10-27 NOTE — Progress Notes (Signed)
RN called NP secondary to pt having t 103, with resp in 30's and sinus tach in the 120-130s. NP to bedside.  S: Pt unable to participate in ROS secondary to lethargy. RN reports pt has been more lethargic but arouses for short time with name calling and tactile stimuli.  O: VS reviewed. Sinus tach on tele. Pt arouses to name calling but falls back to sleep. Respirations increased but no use of accessory muscles. Skin is pale, but warm. Icepacks to axilla. Foley to SD. Last creat 1.5.  A/P: 1. Fever-pt has known infection to rt knee and is s/p ortho procedure today. She has had bld and wound cxs recently and is on Vanc. Will increase IVF to 100cc/hr til a.m. And get a stat CBC with diff and BMP. Tylenol already given. Not going to tx sinus tach at present as is likely compensation for fever/infection. RN to keep me informed. Maren Reamer, NP [facility]

## 2011-10-27 NOTE — Progress Notes (Signed)
*  PRELIMINARY RESULTS* Echocardiogram 2D Echocardiogram has been performed.  Glean Salen [facility] 10/27/2011, 3:23 PM

## 2011-10-27 NOTE — Progress Notes (Signed)
ID CONSULT NOTE  REASON FOR CONSULT:  Anitibiotic treatment in infected knee prosthesis  HPI: Catherine [name] is a 77 yo patient with a hx of right knee replacement in 2004 I believe, who underwent knee revision for infection 2 years ago. She presented to [facility] on 03/06 with right knee pain, fever and chills, and altered mental status. She underwent a knee aspiration  At the ER that showed  5000+ WBC and Gram + cocci in clusters .She was transferred to [facility] for further evaluation and treatment. She was found to have cellulitis on her right leg as well. An irrigation and drainage was performed with poly exchange today since it was acute. Subsequent blood cultures have grown out GPC.  She is currently on vancomycin.  Her creat has increased.    Review of Systems : Negative except as described above.  Past Medical History  Diagnosis Date  . Sleep apnea   . Hypertension   . Hypothyroidism   . GERD (gastroesophageal reflux disease)   . Arthritis   . Anxiety   . Diabetes mellitus    No past surgical history on file.  No current facility-administered medications on file prior to encounter.   No current outpatient prescriptions on file prior to encounter.    Family history is non contributory. Objective: Vital signs in last 24 hours: Temp:  [99.7 F (37.6 C)-103.3 F (39.6 C)] 101.3 F (38.5 C) (03/07 0800) Pulse Rate:  [29-132] 123  (03/07 0500) Resp:  [18-32] 27  (03/07 0500) BP: (97-147)/(35-63) 101/35 mmHg (03/07 0500) SpO2:  [91 %-100 %] 98 % (03/07 0500) FiO2 (%):  [32 %-36 %] 36 % (03/06 2234) Weight:  [129.4 kg (285 lb 4.4 oz)-130 kg (286 lb 9.6 oz)] 129.4 kg (285 lb 4.4 oz) (03/06 2200)  Intake/Output from previous day: 03/06 0701 - 03/07 0700 In: 2100 [I.V.:2100] Out: 2100 [Urine:2100] Intake/Output this shift: Total I/O In: 100 [I.V.:100] Out: -   Physical exam: Pt is in severe pain. Slow acting, but cooperative.  CV: tachycardia, RRR, normal s1 s2, no  m/r/g Lungs: CTA bilaterally, no rhonchi, or wheezes MUSC: bandaged joint on the right , compression stockings in place, tender knee  Lab Results  Basename 10/27/11 0103 10/26/11 1015  WBC 9.1 12.2*  HGB 10.9* 11.6*  HCT 32.5* 33.9*  NA 142 141  K 3.7 3.6  CL 102 102  CO2 27 27  BUN 39* 38*  CREATININE 1.78* 1.56*  GLU -- --   Liver Panel  Basename 10/26/11 1015  PROT 6.9  ALBUMIN 3.6  AST 26  ALT 31  ALKPHOS 39  BILITOT 0.7  BILIDIR --  IBILI --   Sedimentation Rate  Basename 10/27/11 0103  ESRSEDRATE 50*   C-Reactive Protein No results found for this basename: CRP:2 in the last 72 hours  Microbiology: Recent Results (from the past 240 hour(s))  CULTURE, BLOOD (ROUTINE X 2)     Status: Normal (Preliminary result)   Collection Time   10/26/11 11:00 AM      Component Value Range Status Comment   Specimen Description BLOOD ARM RIGHT   Final    Special Requests BOTTLES DRAWN AEROBIC ONLY 10 CC    Final    Culture  Setup Time [id number]   Final    Culture     Final    Value: GRAM POSITIVE COCCI IN CLUSTERS     Note: Gram Stain Report Called to,Read Back By and Verified With: LACY HITT @0401  ON 10/27/2011  BY MCLET   Report Status PENDING   Incomplete   CULTURE, BLOOD (ROUTINE X 2)     Status: Normal (Preliminary result)   Collection Time   10/26/11 11:10 AM      Component Value Range Status Comment   Specimen Description BLOOD HAND RIGHT   Final    Special Requests BOTTLES DRAWN AEROBIC AND ANAEROBIC [facility]   Final    Culture  Setup Time [id number]   Final    Culture     Final    Value: GRAM POSITIVE COCCI IN CLUSTERS     Note: Gram Stain Report Called to,Read Back By and Verified With: LACY HITT @ 0300 ON 10/27/2011 BY MCLET   Report Status PENDING   Incomplete   MRSA PCR SCREENING     Status: Normal   Collection Time   10/26/11 11:17 AM      Component Value Range Status Comment   MRSA by PCR NEGATIVE  NEGATIVE  Final   WOUND CULTURE     Status: Normal  (Preliminary result)   Collection Time   10/26/11  7:29 PM      Component Value Range Status Comment   Specimen Description WOUND KNEE RIGHT   Final    Special Requests NONE   Final    Gram Stain     Final    Value: MODERATE WBC PRESENT, PREDOMINANTLY PMN     NO SQUAMOUS EPITHELIAL CELLS SEEN     RARE GRAM POSITIVE COCCI IN PAIRS   Culture Culture reincubated for better growth   Final    Report Status PENDING   Incomplete   ANAEROBIC CULTURE     Status: Normal (Preliminary result)   Collection Time   10/26/11  7:29 PM      Component Value Range Status Comment   Specimen Description WOUND KNEE RIGHT   Final    Special Requests NONE   Final    Gram Stain     Final    Value: MODERATE WBC PRESENT, PREDOMINANTLY PMN     NO SQUAMOUS EPITHELIAL CELLS SEEN     RARE GRAM POSITIVE COCCI IN PAIRS   Culture PENDING   Incomplete    Report Status PENDING   Incomplete     Studies/Results: Dg Chest Port 1 View  10/26/2011  *RADIOLOGY REPORT*  Clinical Data: Central line placement.  Chest pain and shortness of breath peri  PORTABLE CHEST - 1 VIEW 9:06 p.m.  Comparison: 10/26/2011 at the 10:36 a.m.  Findings: Jugular vein catheter has been inserted on the right and the tip is in the superior vena cava just above the azygos vein. Cardiomegaly.  Pulmonary vascularity is improved.  Minimal atelectasis at the left base.  No pneumothorax.  IMPRESSION: Central line appears in good position.  Improved vascularity.  Original Report Authenticated By: Gwynn Burly, M.D.   Chest Portable 1 View  10/26/2011  *RADIOLOGY REPORT*  Clinical Data: Chest pain and shortness of breath.  PORTABLE CHEST - 1 VIEW  Comparison: 10/20/2009  Findings: The cardiopericardial silhouette is enlarged. There is pulmonary vascular congestion without overt pulmonary edema.  There may be some interstitial pulmonary edema at the lung bases.  No substantial pleural effusion.  Telemetry leads overlie the chest.  IMPRESSION: Cardiomegaly with  vascular congestion and low lung volumes.  Original Report Authenticated By: ERIC A. MANSELL, M.D.   X-ray Knee Right Port  10/26/2011  *RADIOLOGY REPORT*  Clinical Data: Right knee infection.  Total knee arthroplasty.  PORTABLE  RIGHT KNEE - 1-2 VIEW  Comparison: 12/22/2009  Findings: There are multiple antibiotic impregnated beads seen in the right knee joint.  Prosthesis remains in place, unchanged.  IMPRESSION: Antibiotic beads in place.  Original Report Authenticated By: Gwynn Burly, M.D.    Medications: vancomycin (VANCOCIN) 1,750 mg in sodium chloride 0.9 % 500 mL IVPB  Assessment/Plan:  1. Bacteremia: Cultures grow Gram positive cocci in clusters.  Will await final ID.  Concerned for Staph bacteremia and or endocarditis. Will need a TTE. She will need a prolonged course of antibiotics so TEE (if Staph) will not be needed. Continue vancomyin.     2. Knee infection. Will continue antibiotic treatment and await ID.    Thanks for the consult and will follow along.   LOS: 1 day    Christopulos, Georgios 10/27/2011, 10:18 AM

## 2011-10-27 NOTE — H&P (Signed)
NAME:  Catherine [name], Catherine [name]              ACCOUNT NO.:  [id number]  MEDICAL RECORD NO.:  [id number]  LOCATION:  3316                         FACILITY:  [facility]  PHYSICIAN:  Burnard Bunting, M.D.    DATE OF BIRTH:  [birth year]  DATE OF ADMISSION:  10/26/2011 DATE OF DISCHARGE:                             HISTORY & PHYSICAL   CHIEF COMPLAINT:  Right knee pain.  HISTORY OF PRESENT ILLNESS:  Catherine [name] is a 77 year old female who is about 2 years out from right TKA revision for infection.  She had been doing well until 1 day ago when she developed right knee pain and fevers.  She was actually seen in the emergency room in [place]. Aspiration was performed at that time, did show gram-positive cocci and clusters.  She did have an aspiration performed at the emergency room and it showed 5000+ white blood cells and gram-positive cocci.  On exam, she does have cellulitis in the right lower extremity.  Pedal pulses are palpable.  Knee has trace effusion, but general synovitis was present.  White count at this time is 12000.  She has had a fever to over a 100 today.  IMPRESSION:  Infected revised right total knee arthroplasty.  PLAN:  The patient has some other medical problems.  At this time, the patient has volume overload and I am going to have the medical doctor see her.  She has long stem components in both tibia and femur, extracted nodes would be significant events.  I think that because of her general medical condition and because of the acute onset of the symptoms, I am going to attempt an irrigation and debridement, poly exchange and antibiotic suppression.  If that does not work, then we will have the discussion about put her on extraction versus more radical procedure.  The patient understands the risks and benefits of surgery. All questions were answered.     Burnard Bunting, M.D.     GSD/MEDQ  D:  10/26/2011  T:  10/27/2011  Job:  [id number]

## 2011-10-27 NOTE — Progress Notes (Signed)
CSW received referral for pt needing SNF placement. CSW completed psychosocial assessment (located in shadow chart). Per RN, pt with septicemia at this time.   CSW will return to complete more thorough assessment when pt is better able to participate and will follow to facilitate d/c pt as pt progresses medically.   Baxter Flattery, MSW [phone]

## 2011-10-27 NOTE — Progress Notes (Signed)
Patient ID: Catherine [name]    JYN:[id number]    DOB: 09-02-1934    DOA: 10/26/2011  PCP: Everlean Cherry, MD, MD  Medicine consult follow up note:  Subjective: Somewhat disoriented, complaining of pain in the right knee and right shoulder, spiking fevers  Objective: Weight change:   Intake/Output Summary (Last 24 hours) at 10/27/11 1120 Last data filed at 10/27/11 0701  Gross per 24 hour  Intake   2200 ml  Output   1400 ml  Net    800 ml   Blood pressure 101/35, pulse 123, temperature 101.3 F (38.5 C), temperature source Rectal, resp. rate 27, height 5\' 6"  (1.676 m), weight 129.4 kg (285 lb 4.4 oz), SpO2 98.00%.  Physical Exam: General: Alert and awake, disoriented, not in any acute distress. HEENT: anicteric sclera, pupils reactive to light and accommodation, EOMI CVS: S1-S2 clear, sinus tachycardia, no murmur rubs or gallops Chest: No wheezing on auscultation anteriorly Abdomen: soft nontender, nondistended, normal bowel sounds, no organomegaly Extremities: Right lower extremity in immobilizer     Lab Results: Basic Metabolic Panel:  Lab 10/27/11 8657 10/26/11 1015  NA 142 141  K 3.7 3.6  CL 102 102  CO2 27 27  GLUCOSE 191* 176*  BUN 39* 38*  CREATININE 1.78* 1.56*  CALCIUM 9.0 8.9  MG -- --  PHOS -- --   Liver Function Tests:  Lab 10/26/11 1015  AST 26  ALT 31  ALKPHOS 39  BILITOT 0.7  PROT 6.9  ALBUMIN 3.6   CBC:  Lab 10/27/11 0103 10/26/11 1015  WBC 9.1 12.2*  NEUTROABS 7.9* --  HGB 10.9* 11.6*  HCT 32.5* 33.9*  MCV 92.1 89.4  PLT 128* 131*     Lab 10/27/11 0758 10/27/11 0411 10/27/11 0013 10/26/11 2300 10/26/11 2015  GLUCAP 146* 161* 178* 173* 153*     Micro Results: Recent Results (from the past 240 hour(s))  CULTURE, BLOOD (ROUTINE X 2)     Status: Normal (Preliminary result)   Collection Time   10/26/11 11:00 AM      Component Value Range Status Comment   Specimen Description BLOOD ARM RIGHT   Final    Special Requests BOTTLES DRAWN  AEROBIC ONLY 10 CC    Final    Culture  Setup Time [id number]   Final    Culture     Final    Value: GRAM POSITIVE COCCI IN CLUSTERS     Note: Gram Stain Report Called to,Read Back By and Verified With: LACY HITT @0401  ON 10/27/2011 BY MCLET   Report Status PENDING   Incomplete   CULTURE, BLOOD (ROUTINE X 2)     Status: Normal (Preliminary result)   Collection Time   10/26/11 11:10 AM      Component Value Range Status Comment   Specimen Description BLOOD HAND RIGHT   Final    Special Requests BOTTLES DRAWN AEROBIC AND ANAEROBIC [facility]   Final    Culture  Setup Time [id number]   Final    Culture     Final    Value: GRAM POSITIVE COCCI IN CLUSTERS     Note: Gram Stain Report Called to,Read Back By and Verified With: LACY HITT @ 0300 ON 10/27/2011 BY MCLET   Report Status PENDING   Incomplete   MRSA PCR SCREENING     Status: Normal   Collection Time   10/26/11 11:17 AM      Component Value Range Status Comment   MRSA by PCR NEGATIVE  NEGATIVE  Final   WOUND CULTURE     Status: Normal (Preliminary result)   Collection Time   10/26/11  7:29 PM      Component Value Range Status Comment   Specimen Description WOUND KNEE RIGHT   Final    Special Requests NONE   Final    Gram Stain     Final    Value: MODERATE WBC PRESENT, PREDOMINANTLY PMN     NO SQUAMOUS EPITHELIAL CELLS SEEN     RARE GRAM POSITIVE COCCI IN PAIRS   Culture Culture reincubated for better growth   Final    Report Status PENDING   Incomplete   ANAEROBIC CULTURE     Status: Normal (Preliminary result)   Collection Time   10/26/11  7:29 PM      Component Value Range Status Comment   Specimen Description WOUND KNEE RIGHT   Final    Special Requests NONE   Final    Gram Stain     Final    Value: MODERATE WBC PRESENT, PREDOMINANTLY PMN     NO SQUAMOUS EPITHELIAL CELLS SEEN     RARE GRAM POSITIVE COCCI IN PAIRS   Culture PENDING   Incomplete    Report Status PENDING   Incomplete     Studies/Results: Dg Chest Port 1  View  10/26/2011  *RADIOLOGY REPORT*  Clinical Data: Central line placement.  Chest pain and shortness of breath peri  PORTABLE CHEST - 1 VIEW 9:06 p.m.  Comparison: 10/26/2011 at the 10:36 a.m.  Findings: Jugular vein catheter has been inserted on the right and the tip is in the superior vena cava just above the azygos vein. Cardiomegaly.  Pulmonary vascularity is improved.  Minimal atelectasis at the left base.  No pneumothorax.  IMPRESSION: Central line appears in good position.  Improved vascularity.  Original Report Authenticated By: Gwynn Burly, M.D.   Chest Portable 1 View  10/26/2011  *RADIOLOGY REPORT*  Clinical Data: Chest pain and shortness of breath.  PORTABLE CHEST - 1 VIEW  Comparison: 10/20/2009  Findings: The cardiopericardial silhouette is enlarged. There is pulmonary vascular congestion without overt pulmonary edema.  There may be some interstitial pulmonary edema at the lung bases.  No substantial pleural effusion.  Telemetry leads overlie the chest.  IMPRESSION: Cardiomegaly with vascular congestion and low lung volumes.  Original Report Authenticated By: ERIC A. MANSELL, M.D.   X-ray Knee Right Port  10/26/2011  *RADIOLOGY REPORT*  Clinical Data: Right knee infection.  Total knee arthroplasty.  PORTABLE RIGHT KNEE - 1-2 VIEW  Comparison: 12/22/2009  Findings: There are multiple antibiotic impregnated beads seen in the right knee joint.  Prosthesis remains in place, unchanged.  IMPRESSION: Antibiotic beads in place.  Original Report Authenticated By: Gwynn Burly, M.D.    Medications: Scheduled Meds:   . acetaminophen  325 mg Rectal NOW  . furosemide  40 mg Intravenous STAT  . insulin aspart  0-9 Units Subcutaneous Q4H  . insulin glargine  10 Units Subcutaneous QHS  . metoprolol succinate  25 mg Oral BID  . patient's guide to using coumadin book   Does not apply Once  . vancomycin  1,750 mg Intravenous Q24H  . vancomycin  500 mg Intravenous Once  . warfarin  5 mg Oral  Once  . warfarin   Does not apply Once  . Warfarin - Pharmacist Dosing Inpatient   Does not apply q1800  . DISCONTD: insulin glargine  10 Units Subcutaneous QHS  . DISCONTD:  vancomycin  1,250 mg Intravenous Q12H  . DISCONTD: vancomycin  1,000 mg Other To OR   Continuous Infusions:   . 0.9 % NaCl with KCl 20 mEq / L 100 mL/hr at 10/27/11 0103  . 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 August Saucer, cultures growing GPC - Blood cultures also growing GPC - ID consultation obtained, on vancomycin, will defer to ID for broad-spectrum antibiotics - Ordered 2-D echo for GPC sepsis to rule out endocarditis - Continue pain control (per primary service)  Active Problems:  HYPOTHYROIDISM: Ordered TSH, fT4, tT3   DIABETES MELLITUS, TYPE II, WITH NEUROLOGICAL COMPLICATIONS: Blood sugars fairly controlled this morning. Follow BS for next 24 hours to adjust insulin regimen    OBSTRUCTIVE SLEEP APNEA: Does not use CPAP   HYPERTENSION: stable for now   Anemia: Monitor closely, currently stable   Tachycardia: Likely secondary to acute infection with GPC sepsis,  On metoprolol  DVT Prophylaxis: On Coumadin  Disposition: Per primary service   LOS: 1 day   Marieelena Bartko M.D. [facility] 10/27/2011, 11:20 AM Pager: [phone]

## 2011-10-27 NOTE — Progress Notes (Signed)
Spoke with patient's son at bedside. He was extremely upset and unpleasant to speak with due to him cussing and raising his voice. I tried to explain the plan of care and to listen to his frustrations. Patient requested that her son calm down and to not cuss. He listened to this and left soon after. Will continue to monitor patient for any changes.

## 2011-10-27 NOTE — Progress Notes (Signed)
PT Cancellation Note  Treatment cancelled today due to medical issues with patient which prohibited therapy. Pt is still spiking fevers of 101.3 and is currently on a cooling blanket. RN requested to attempt evaluation tomorrow pending pts medical stability. Thanks,  10/27/2011 Milana Kidney DPT PAGER: [phone] OFFICE: [phone]   Milana Kidney 10/27/2011, 4:37 PM

## 2011-10-27 NOTE — Progress Notes (Signed)
Patient temp found to be 102.7 rectally. Pt HR now in the 120's and BP 107/69 and pt awake and asking for PO fluids. Gave another tylenol suppository. Will continue to monitor.

## 2011-10-27 NOTE — Progress Notes (Signed)
Subjective: Pain moderately well controlled   Objective: Vital signs in last 24 hours: Temp:  [99.7 F (37.6 C)-103.3 F (39.6 C)] 101.3 F (38.5 C) (03/07 0600) Pulse Rate:  [29-132] 123  (03/07 0500) Resp:  [18-32] 27  (03/07 0500) BP: (97-147)/(35-63) 101/35 mmHg (03/07 0500) SpO2:  [89 %-100 %] 98 % (03/07 0500) FiO2 (%):  [32 %-36 %] 36 % (03/06 2234) Weight:  [129.4 kg (285 lb 4.4 oz)-130 kg (286 lb 9.6 oz)] 129.4 kg (285 lb 4.4 oz) (03/06 2200)  Intake/Output from previous day: 03/06 0701 - 03/07 0700 In: 2100 [I.V.:2100] Out: 2100 [Urine:2100] Intake/Output this shift:    Exam:  Sensation intact distally Intact pulses distally Dorsiflexion/Plantar flexion intact Still febrile - hr increased Labs:  Basename 10/27/11 0103 10/26/11 1015  HGB 10.9* 11.6*    Basename 10/27/11 0103 10/26/11 1015  WBC 9.1 12.2*  RBC 3.53* 3.79*  HCT 32.5* 33.9*  PLT 128* 131*    Basename 10/27/11 0103 10/26/11 1015  NA 142 141  K 3.7 3.6  CL 102 102  CO2 27 27  BUN 39* 38*  CREATININE 1.78* 1.56*  GLUCOSE 191* 176*  CALCIUM 9.0 8.9    Basename 10/27/11 0430  LABPT --  INR 1.33    Assessment/Plan: Pt still febrile s/p I/d poly exchange - wbc down - cr up - on vanco - ID consult pendind - will need to stay in unit today until medically stable - on coumadin for DVT prophylaxis   DEAN,GREGORY SCOTT 10/27/2011, 8:13 AM

## 2011-10-27 NOTE — Op Note (Signed)
NAME:  Catherine [name], Catherine [name]              ACCOUNT NO.:  [id number]  MEDICAL RECORD NO.:  [id number]  LOCATION:  3316                         FACILITY:  [facility]  PHYSICIAN:  Burnard Bunting, M.D.    DATE OF BIRTH:  21-Oct-1934  DATE OF PROCEDURE:  10/26/2011 DATE OF DISCHARGE:                              OPERATIVE REPORT   PREOPERATIVE DIAGNOSIS:  Right total knee arthroplasty infection.  POSTOPERATIVE DIAGNOSIS:  Right total knee arthroplasty infection.  PROCEDURE:  Right total knee arthroplasty incision and debridement, poly exchange.  SURGEON:  Burnard Bunting, MD  ASSISTANT:  Vanita Panda. [name]  ANESTHESIA:  General endotracheal.  ESTIMATED BLOOD LOSS:  100 mL.  CULTURES:  Times 1.  Antibiotic beads with vancomycin 1 g, gentamicin 480 mg used.  TOURNIQUET TIME:  55 minutes at 300 mmHg.  INDICATION:  Catherine [name] is a 77 year old patient who is 2 years out right TKA two-stage revision for infection.  The patient presented after 1- to 2-day history of right knee pain and fevers.  The patient had venous stasis ulcers and cellulitis in her right lower extremity below the total knee replacement.  She presents now for operative management after aspiration that was positive for gram-positive cocci.  PROCEDURE IN DETAIL:  The patient was brought to the operating room, where general endotracheal anesthesia was achieved.  Perioperative IV antibiotics were administered because preoperative cultures had already been obtained.  Time-out was called.  Right leg was pre-scrubbed including the foot with alcohol and then prepped with ChloraPrep solution.  Catherine [name] was used to cover the operative field.  Leg was elevated but not exsanguinated.  Tourniquet was inflated.  Skin and subcutaneous tissue was sharply divided over the incision.  Median and parapatellar approach was made.  Gross purulence was present within the confined compartment of the knee.  The cultures were obtained.   Complete synovectomy was performed.  The polyethylene was removed.  Again using a curette and sharp dissection, a complete synovectomy and debridement of the exposed capsular and soft tissue was performed.  Following extensive synovectomy and debridement and removal of the polyethylene, 9 liters of irrigating solution were passed through the knee.  Following this, thrombin was used to irrigate the knee surface. A new 12.5 poly spacer was placed to replace the 15 poly spacer because of general exposure issues.  Smaller poly was chosen which gave a good stability to the knee in varus valgus stress at 0 and 30 degrees.  Tourniquet was then released.  Antibiotic bleeding points encountered which was electrocauterized with electrocautery. Stimulan antibiotic beads were then placed posteriorly, laterally, and anteriorly.  The incision was then closed using a #1 Vicryl suture to obtain water-tight seal around the knee joint followed by interrupted and inverted 0 Vicryl suture, 2-0 Vicryl suture, and skin staples.  Bulky, well-padded knee immobilizer was placed.  Catherine [name]'s assistance was required during the case for retraction and positioning.  His assistance was a medical necessity. She was transferred to the recovery room in stable condition.     Burnard Bunting, M.D.     GSD/MEDQ  D:  10/26/2011  T:  10/27/2011  Job:  [id number]

## 2011-10-27 NOTE — Progress Notes (Signed)
ANTICOAGULATION CONSULT NOTE - Follow Up Consult  Pharmacy Consult for Coumadin Indication: VTE prophylaxis  Allergies  Allergen Reactions  . Gabapentin     REACTION: rash, diarrhea    Patient Measurements: Height: 5\' 6"  (167.6 cm) Weight: 285 lb 4.4 oz (129.4 kg) IBW/kg (Calculated) : 59.3   Vital Signs: Temp: 102 F (38.9 C) (03/07 1542) Temp src: Rectal (03/07 1542) BP: 102/73 mmHg (03/07 1542) Pulse Rate: 122  (03/07 1500)  Labs:  Basename 10/27/11 0430 10/27/11 0103 10/26/11 1015  HGB -- 10.9* 11.6*  HCT -- 32.5* 33.9*  PLT -- 128* 131*  APTT -- -- --  LABPROT 16.7* -- --  INR 1.33 -- --  HEPARINUNFRC -- -- --  CREATININE -- 1.78* 1.56*  CKTOTAL -- -- --  CKMB -- -- --  TROPONINI -- -- --   Estimated Creatinine Clearance: 38.2 ml/min (by C-G formula based on Cr of 1.78).   Medications:  Scheduled:    . acetaminophen  325 mg Rectal NOW  . insulin aspart  0-9 Units Subcutaneous Q4H  . insulin glargine  10 Units Subcutaneous QHS  . metoprolol succinate  25 mg Oral BID  . patient's guide to using coumadin book   Does not apply Once  . vancomycin  1,750 mg Intravenous Q24H  . vancomycin  500 mg Intravenous Once  . warfarin  5 mg Oral Once  . warfarin   Does not apply Once  . Warfarin - Pharmacist Dosing Inpatient   Does not apply q1800  . DISCONTD: vancomycin  1,000 mg Other To OR    Assessment: 77 y/o female patient s/p right TKA requiring coumadin for DVT px. Dose not charted as given last night, therefore INR has not moved.   Goal of Therapy:  INR 2-3   Plan:  Coumadin 7.5mg  today and f/u daily protime.  Verlene Mayer, PharmD, BCPS Pager [phone] 10/27/2011,4:22 PM

## 2011-10-27 NOTE — Progress Notes (Signed)
[facility] lab reported that patient blood cultures were positive for gram positive cocci in both aerobic and anaerobic bottles. Patient currently on IV vancomycin.

## 2011-10-27 NOTE — Progress Notes (Signed)
Orthopedic Tech Progress Note Patient Details:  Catherine [name] 1935/07/22 [id number]  Patient ID: Catherine [name], female   DOB: 1935-05-15, 77 y.o.   MRN: [id number]   Shawnie Pons 10/27/2011, 1:38 PM Knee Immobilizer

## 2011-10-28 ENCOUNTER — Encounter (HOSPITAL_COMMUNITY): Payer: Self-pay | Admitting: Orthopedic Surgery

## 2011-10-28 ENCOUNTER — Inpatient Hospital Stay (HOSPITAL_COMMUNITY): Payer: Medicare Other

## 2011-10-28 DIAGNOSIS — Y831 Surgical operation with implant of artificial internal device as the cause of abnormal reaction of the patient, or of later complication, without mention of misadventure at the time of the procedure: Secondary | ICD-10-CM

## 2011-10-28 DIAGNOSIS — T8450XA Infection and inflammatory reaction due to unspecified internal joint prosthesis, initial encounter: Secondary | ICD-10-CM

## 2011-10-28 LAB — GLUCOSE, CAPILLARY
Glucose-Capillary: 147 mg/dL — ABNORMAL HIGH (ref 70–99)
Glucose-Capillary: 146 mg/dL — ABNORMAL HIGH (ref 70–99)

## 2011-10-28 LAB — CBC
HCT: 29.4 % — ABNORMAL LOW (ref 36.0–46.0)
MCH: 30.3 pg (ref 26.0–34.0)
MCV: 91.9 fL (ref 78.0–100.0)
RBC: 3.2 MIL/uL — ABNORMAL LOW (ref 3.87–5.11)
WBC: 4.7 10*3/uL (ref 4.0–10.5)

## 2011-10-28 LAB — BASIC METABOLIC PANEL
CO2: 25 mEq/L (ref 19–32)
Chloride: 105 mEq/L (ref 96–112)
Sodium: 138 mEq/L (ref 135–145)

## 2011-10-28 LAB — T4, FREE: Free T4: 0.98 ng/dL (ref 0.80–1.80)

## 2011-10-28 MED ORDER — SODIUM CHLORIDE 0.9 % IJ SOLN
INTRAMUSCULAR | Status: AC
  Filled 2011-10-28: qty 20

## 2011-10-28 MED ORDER — RIFAMPIN 300 MG PO CAPS
600.0000 mg | ORAL_CAPSULE | Freq: Every day | ORAL | Status: DC
  Administered 2011-10-28 – 2011-11-01 (×5): 600 mg via ORAL
  Filled 2011-10-28 (×6): qty 2

## 2011-10-28 MED ORDER — INSULIN ASPART 100 UNIT/ML ~~LOC~~ SOLN
0.0000 [IU] | Freq: Three times a day (TID) | SUBCUTANEOUS | Status: DC
  Administered 2011-10-28 (×2): 1 [IU] via SUBCUTANEOUS
  Administered 2011-10-28 – 2011-10-29 (×2): 2 [IU] via SUBCUTANEOUS
  Filled 2011-10-28: qty 3

## 2011-10-28 MED ORDER — SODIUM CHLORIDE 0.9 % IJ SOLN
INTRAMUSCULAR | Status: AC
  Administered 2011-10-28: 10 mL
  Filled 2011-10-28: qty 20

## 2011-10-28 MED ORDER — LEVALBUTEROL HCL 0.63 MG/3ML IN NEBU
0.6300 mg | INHALATION_SOLUTION | RESPIRATORY_TRACT | Status: DC | PRN
  Administered 2011-10-28 – 2011-10-29 (×2): 0.63 mg via RESPIRATORY_TRACT
  Filled 2011-10-28 (×2): qty 3

## 2011-10-28 MED ORDER — WARFARIN SODIUM 7.5 MG PO TABS
7.5000 mg | ORAL_TABLET | Freq: Once | ORAL | Status: AC
  Administered 2011-10-28: 7.5 mg via ORAL
  Filled 2011-10-28: qty 1

## 2011-10-28 MED FILL — Perflutren Lipid Microsphere IV Susp 6.52 MG/ML: INTRAVENOUS | Qty: 2 | Status: AC

## 2011-10-28 NOTE — Progress Notes (Addendum)
[facility] [place] TEAM 1 - Stepdown/ICU TEAM  Subjective: 77 year old female with known history of type 2 diabetes mellitus, hypothyroidism, hypercholesterolemia, obstructive sleep apnea, hypertension, status post right total knee arthroplasty with subsequent infection and extraction 2/25 2011 and subsequent reimplantation of right total knee spacer 12/22/09 presented to [facility] where her knee joint was aspirated. The aspirate showed 5,000+ WBC and gram positive cocci. She was then sent to [facility] for definitive treatment, where she was admitted by the Orthopedic service.    Pt continues to have fevers.  She is more alert and interactive today.  She denies sob, cp, n/v, or abdom pain.    Objective: Weight change:   Intake/Output Summary (Last 24 hours) at 10/28/11 1111 Last data filed at 10/28/11 0900  Gross per 24 hour  Intake 1436.67 ml  Output    975 ml  Net 461.67 ml   Blood pressure 150/63, pulse 121, temperature 100.9 F (38.3 C), temperature source Rectal, resp. rate 28, height 5\' 6"  (1.676 m), weight 129.4 kg (285 lb 4.4 oz), SpO2 98.00%.  Physical Exam: General: No acute respiratory distress Lungs: Clear to auscultation bilaterally without wheezes with exception to mild bibasilar crackles Cardiovascular: tachycardic but with regular rhythm without murmur gallop or rub Abdomen: morbidly obese, nontender, nondistended, soft, bowel sounds positive, no rebound, no ascites, no appreciable mass Extremities: 1+ B LE edema - R LE in knee immobilizer   Lab Results:  Basename 10/28/11 0425 10/27/11 0103 10/26/11 1015  NA 138 142 141  K 3.9 3.7 3.6  CL 105 102 102  CO2 25 27 27   GLUCOSE 135* 191* 176*  BUN 23 39* 38*  CREATININE 1.22* 1.78* 1.56*  CALCIUM 8.5 9.0 8.9  MG -- -- --  PHOS -- -- --    Basename 10/26/11 1015  AST 26  ALT 31  ALKPHOS 39  BILITOT 0.7  PROT 6.9  ALBUMIN 3.6    Basename 10/28/11 0425 10/27/11 0103 10/26/11 1015  WBC 4.7  9.1 12.2*  NEUTROABS -- 7.9* 10.6*  HGB 9.7* 10.9* 11.6*  HCT 29.4* 32.5* 33.9*  MCV 91.9 92.1 89.4  PLT 122* 128* 131*   Micro Results: Recent Results (from the past 240 hour(s))  CULTURE, BLOOD (ROUTINE X 2)     Status: Normal (Preliminary result)   Collection Time   10/26/11 11:00 AM      Component Value Range Status Comment   Specimen Description BLOOD ARM RIGHT   Final    Special Requests BOTTLES DRAWN AEROBIC ONLY 10 CC    Final    Culture  Setup Time [id number]   Final    Culture     Final    Value: STAPHYLOCOCCUS AUREUS     Note: Gram Stain Report Called to,Read Back By and Verified With: LACY HITT @0401  ON 10/27/2011 BY MCLET   Report Status PENDING   Incomplete   CULTURE, BLOOD (ROUTINE X 2)     Status: Normal (Preliminary result)   Collection Time   10/26/11 11:10 AM      Component Value Range Status Comment   Specimen Description BLOOD HAND RIGHT   Final    Special Requests BOTTLES DRAWN AEROBIC AND ANAEROBIC 10CC EACH   Final    Culture  Setup Time [id number]   Final    Culture     Final    Value: STAPHYLOCOCCUS AUREUS     Note: RIFAMPIN AND GENTAMICIN SHOULD NOT BE USED AS SINGLE DRUGS FOR TREATMENT OF STAPH  INFECTIONS.     Note: Gram Stain Report Called to,Read Back By and Verified With: LACY HITT @ 0300 ON 10/27/2011 BY MCLET   Report Status PENDING   Incomplete   MRSA PCR SCREENING     Status: Normal   Collection Time   10/26/11 11:17 AM      Component Value Range Status Comment   MRSA by PCR NEGATIVE  NEGATIVE  Final   WOUND CULTURE     Status: Normal (Preliminary result)   Collection Time   10/26/11  7:29 PM      Component Value Range Status Comment   Specimen Description WOUND KNEE RIGHT   Final    Special Requests NONE   Final    Gram Stain     Final    Value: MODERATE WBC PRESENT, PREDOMINANTLY PMN     NO SQUAMOUS EPITHELIAL CELLS SEEN     RARE GRAM POSITIVE COCCI IN PAIRS   Culture     Final    Value: ABUNDANT STAPHYLOCOCCUS AUREUS     Note:  RIFAMPIN AND GENTAMICIN SHOULD NOT BE USED AS SINGLE DRUGS FOR TREATMENT OF STAPH INFECTIONS.   Report Status PENDING   Incomplete   ANAEROBIC CULTURE     Status: Normal (Preliminary result)   Collection Time   10/26/11  7:29 PM      Component Value Range Status Comment   Specimen Description WOUND KNEE RIGHT   Final    Special Requests NONE   Final    Gram Stain     Final    Value: MODERATE WBC PRESENT, PREDOMINANTLY PMN     NO SQUAMOUS EPITHELIAL CELLS SEEN     RARE GRAM POSITIVE COCCI IN PAIRS   Culture     Final    Value: NO ANAEROBES ISOLATED; CULTURE IN PROGRESS FOR 5 DAYS   Report Status PENDING   Incomplete     Studies/Results: All recent x-ray/radiology reports have been reviewed in detail.   Medications: I have reviewed the patient's complete medication list.  Reccomendations:  R knee septic arthritis (recurrent) S/p I&D w/ poly exchange - care as per primary service  Blood cx+ 2/2 - MSSA vs/ MRSA As per ID, must consider possibility of SBE - to complete prolonged course of abx for above, so TEE will not add/change our tx plan - TTE is w/o evidence of large vegetations, but was of poor quality   Altered mental status/Toxic Metabolic Encephalopathy Due to above - improving w/ tx of same  Tachycardia likey due to fever/bacteremia, as well as DH - cont to hydrate - follow trend  Hypercholesterolemia Resume tx prior to d/c once BP stable   Tobacco abuse Counseling ongoing   Sleep apnea Pt is noncompliant w/ CPAP/does not use - will cont to educate her on need to comply and serious implications   HTN reasonably controlled at present - follow trend w/ hydration  Hypothyroidism? No evidence that she is on hormone replacement as outpt - will check TSH  DM w/ neuropathy Reasonably controlled at this time - cont to follow trend  DVT prophy As ordered by Ortho  Dispo We will continue to follow along with you  Lonia Blood, MD [facility] Office   [phone] Pager [phone]  On-Call/Text Page:      [email or web address]      password [facility]

## 2011-10-28 NOTE — Progress Notes (Signed)
Attempted to see pt this am. Pt remains with very high fever, cooling blanket and HR at rest at 120.  Will hold therapy for today and check back as patient's medical condition stabilizes. Tory Emerald, [place] [phone]

## 2011-10-28 NOTE — Progress Notes (Signed)
Utilization review completed. Damyra Luscher, RN, BSN.  10/28/11  

## 2011-10-28 NOTE — Progress Notes (Signed)
ANTICOAGULATION CONSULT NOTE - Follow Up Consult  Pharmacy Consult for Coumadin Indication: VTE prophylaxis  Allergies  Allergen Reactions  . Gabapentin     REACTION: rash, diarrhea    Patient Measurements: Height: 5\' 6"  (167.6 cm) Weight: 285 lb 4.4 oz (129.4 kg) IBW/kg (Calculated) : 59.3   Vital Signs: Temp: 100.9 F (38.3 C) (03/08 0800) Temp src: Rectal (03/08 0800) BP: 150/63 mmHg (03/08 0939) Pulse Rate: 121  (03/08 0939)  Labs:  Basename 10/28/11 0425 10/27/11 0430 10/27/11 0103 10/26/11 1015  HGB 9.7* -- 10.9* --  HCT 29.4* -- 32.5* 33.9*  PLT 122* -- 128* 131*  APTT -- -- -- --  LABPROT 15.3* 16.7* -- --  INR 1.18 1.33 -- --  HEPARINUNFRC -- -- -- --  CREATININE 1.22* -- 1.78* 1.56*  CKTOTAL -- -- -- --  CKMB -- -- -- --  TROPONINI -- -- -- --   Estimated Creatinine Clearance: 55.8 ml/min (by C-G formula based on Cr of 1.22).   Medications:  Scheduled:     . insulin aspart  0-9 Units Subcutaneous Q4H  . insulin glargine  10 Units Subcutaneous QHS  . metoprolol succinate  25 mg Oral BID  . patient's guide to using coumadin book   Does not apply Once  . sodium chloride      . vancomycin  1,750 mg Intravenous Q24H  . warfarin  5 mg Oral Once  . warfarin  7.5 mg Oral ONCE-1800  . warfarin   Does not apply Once  . Warfarin - Pharmacist Dosing Inpatient   Does not apply q1800    Assessment: 77 y/o female patient s/p right TKA requiring coumadin for DVT px. Dose has not been charted as given x past 2 days. INR at baseline. No bleeding noted. SCDs in place.   Goal of Therapy:  INR 2-3   Plan:  Coumadin 7.5mg  today and f/u daily protime.  Junita Push, PharmD, BCPS 10/28/2011,11:27 AM

## 2011-10-28 NOTE — Progress Notes (Signed)
Subjective: Pt stable - fever persists   Objective: Vital signs in last 24 hours: Temp:  [101.2 F (38.4 C)-102 F (38.9 C)] 101.2 F (38.4 C) (03/08 0330) Pulse Rate:  [94-123] 123  (03/08 0330) Resp:  [18-28] 28  (03/08 0330) BP: (102-129)/(50-73) 129/70 mmHg (03/08 0330) SpO2:  [96 %-98 %] 98 % (03/08 0330)  Intake/Output from previous day: 03/07 0701 - 03/08 0700 In: 1296.7 [I.V.:1296.7] Out: 975 [Urine:975] Intake/Output this shift:    Exam:  Dorsiflexion/Plantar flexion intact  Labs:  Basename 10/28/11 0425 10/27/11 0103 10/26/11 1015  HGB 9.7* 10.9* 11.6*    Basename 10/28/11 0425 10/27/11 0103  WBC 4.7 9.1  RBC 3.20* 3.53*  HCT 29.4* 32.5*  PLT 122* 128*    Basename 10/28/11 0425 10/27/11 0103  NA 138 142  K 3.9 3.7  CL 105 102  CO2 25 27  BUN 23 39*  CREATININE 1.22* 1.78*  GLUCOSE 135* 191*  CALCIUM 8.5 9.0    Basename 10/28/11 0425 10/27/11 0430  LABPT -- --  INR 1.18 1.33    Assessment/Plan: gpc septicemia - pt looks better today although febrile - cr down - change dressing tomorrow - will try for long term suppression,- otherwise component extraction will be necessary   Catherine [name] 10/28/2011, 7:23 AM

## 2011-10-28 NOTE — Progress Notes (Signed)
PT Cancellation Note  Treatment cancelled today due to pt with fever, cooling blanket, and HR 120's at rest.  Will hold PT eval at this time.  Please advise when pt appropriate for mobility.  Thanks.    Sunny Schlein, [place] [phone] 10/28/2011, 9:13 AM

## 2011-10-28 NOTE — Progress Notes (Signed)
INFECTIOUS DISEASE PROGRESS NOTE  ID: Catherine [name] is a 77 y.o. female with a hx of right knee replacement in 2004, who underwent knee revision for infection 2 years ago. She presented to [facility] on 03/06 with right knee pain, fever and chills, and altered mental status. She underwent a knee aspiration At the ER that showed 5000+ WBC and Gram + cocci in clusters .She was transferred to [facility] for further evaluation and treatment. She was found to have cellulitis on her right leg as well. An irrigation and drainage was performed with poly exchange today since it was acute. Subsequent blood cultures have grown out Staph aureus.  Awaiting sensitivities.     Subjective: Fells well, wants to go home  Abtx:  Anti-infectives     Start     Dose/Rate Route Frequency Ordered Stop   10/27/11 1500   vancomycin (VANCOCIN) 1,750 mg in sodium chloride 0.9 % 500 mL IVPB        1,750 mg 250 mL/hr over 120 Minutes Intravenous Every 24 hours 10/26/11 1345     10/26/11 1853   vancomycin (VANCOCIN) powder  Status:  Discontinued          As needed 10/26/11 1916 10/26/11 2015   10/26/11 1850   gentamicin (GARAMYCIN) injection  Status:  Discontinued          As needed 10/26/11 1853 10/26/11 2015   10/26/11 1730   vancomycin (VANCOCIN) powder 1,000 mg  Status:  Discontinued        1,000 mg Other To Surgery 10/26/11 1721 10/26/11 2233   10/26/11 1500   vancomycin (VANCOCIN) 500 mg in sodium chloride 0.9 % 100 mL IVPB        500 mg 100 mL/hr over 60 Minutes Intravenous  Once 10/26/11 1345 10/26/11 1702   10/26/11 1030   vancomycin (VANCOCIN) 1,250 mg in sodium chloride 0.9 % 250 mL IVPB  Status:  Discontinued        1,250 mg 166.7 mL/hr over 90 Minutes Intravenous Every 12 hours 10/26/11 0929 10/26/11 1345          Medications: I have reviewed the patient's current medications.  Objective: Vital signs in last 24 hours: Temp:  [100.9 F (38.3 C)-102 F (38.9 C)] 101.5 F (38.6 C) (03/08  1200) Pulse Rate:  [94-123] 121  (03/08 0939) Resp:  [19-28] 28  (03/08 0330) BP: (102-150)/(56-73) 142/67 mmHg (03/08 1200) SpO2:  [96 %-98 %] 98 % (03/08 0330)   General appearance: alert, mild distress and morbidly obese Resp: + diffuse wheezes, RR about 20 but breathing comfortably Cardio: regular rate and rhythm, S1, S2 normal, no murmur, click, rub or gallop GI: soft, non-tender; bowel sounds normal; no masses,  no organomegaly Extremities: wrapped  Lab Results  Basename 10/28/11 0425 10/27/11 0103  WBC 4.7 9.1  HGB 9.7* 10.9*  HCT 29.4* 32.5*  NA 138 142  K 3.9 3.7  CL 105 102  CO2 25 27  BUN 23 39*  CREATININE 1.22* 1.78*  GLU -- --   Liver Panel  Basename 10/26/11 1015  PROT 6.9  ALBUMIN 3.6  AST 26  ALT 31  ALKPHOS 39  BILITOT 0.7  BILIDIR --  IBILI --   Sedimentation Rate  Basename 10/27/11 0103  ESRSEDRATE 50*   C-Reactive Protein No results found for this basename: CRP:2 in the last 72 hours  Microbiology: Recent Results (from the past 240 hour(s))  CULTURE, BLOOD (ROUTINE X 2)     Status:  Normal (Preliminary result)   Collection Time   10/26/11 11:00 AM      Component Value Range Status Comment   Specimen Description BLOOD ARM RIGHT   Final    Special Requests BOTTLES DRAWN AEROBIC ONLY 10 CC    Final    Culture  Setup Time [id number]   Final    Culture     Final    Value: STAPHYLOCOCCUS AUREUS     Note: Gram Stain Report Called to,Read Back By and Verified With: LACY HITT @0401  ON 10/27/2011 BY MCLET   Report Status PENDING   Incomplete   CULTURE, BLOOD (ROUTINE X 2)     Status: Normal (Preliminary result)   Collection Time   10/26/11 11:10 AM      Component Value Range Status Comment   Specimen Description BLOOD HAND RIGHT   Final    Special Requests BOTTLES DRAWN AEROBIC AND ANAEROBIC 10CC EACH   Final    Culture  Setup Time [id number]   Final    Culture     Final    Value: STAPHYLOCOCCUS AUREUS     Note: RIFAMPIN AND GENTAMICIN  SHOULD NOT BE USED AS SINGLE DRUGS FOR TREATMENT OF STAPH INFECTIONS.     Note: Gram Stain Report Called to,Read Back By and Verified With: LACY HITT @ 0300 ON 10/27/2011 BY MCLET   Report Status PENDING   Incomplete   MRSA PCR SCREENING     Status: Normal   Collection Time   10/26/11 11:17 AM      Component Value Range Status Comment   MRSA by PCR NEGATIVE  NEGATIVE  Final   WOUND CULTURE     Status: Normal (Preliminary result)   Collection Time   10/26/11  7:29 PM      Component Value Range Status Comment   Specimen Description WOUND KNEE RIGHT   Final    Special Requests NONE   Final    Gram Stain     Final    Value: MODERATE WBC PRESENT, PREDOMINANTLY PMN     NO SQUAMOUS EPITHELIAL CELLS SEEN     RARE GRAM POSITIVE COCCI IN PAIRS   Culture     Final    Value: ABUNDANT STAPHYLOCOCCUS AUREUS     Note: RIFAMPIN AND GENTAMICIN SHOULD NOT BE USED AS SINGLE DRUGS FOR TREATMENT OF STAPH INFECTIONS.   Report Status PENDING   Incomplete   ANAEROBIC CULTURE     Status: Normal (Preliminary result)   Collection Time   10/26/11  7:29 PM      Component Value Range Status Comment   Specimen Description WOUND KNEE RIGHT   Final    Special Requests NONE   Final    Gram Stain     Final    Value: MODERATE WBC PRESENT, PREDOMINANTLY PMN     NO SQUAMOUS EPITHELIAL CELLS SEEN     RARE GRAM POSITIVE COCCI IN PAIRS   Culture     Final    Value: NO ANAEROBES ISOLATED; CULTURE IN PROGRESS FOR 5 DAYS   Report Status PENDING   Incomplete     Studies/Results: Dg Chest Port 1 View  10/26/2011  *RADIOLOGY REPORT*  Clinical Data: Central line placement.  Chest pain and shortness of breath peri  PORTABLE CHEST - 1 VIEW 9:06 p.m.  Comparison: 10/26/2011 at the 10:36 a.m.  Findings: Jugular vein catheter has been inserted on the right and the tip is in the superior vena cava just above the azygos vein. Cardiomegaly.  Pulmonary vascularity is improved.  Minimal atelectasis at the left base.  No pneumothorax.   IMPRESSION: Central line appears in good position.  Improved vascularity.  Original Report Authenticated By: Gwynn Burly, M.D.   X-ray Knee Right Port  10/26/2011  *RADIOLOGY REPORT*  Clinical Data: Right knee infection.  Total knee arthroplasty.  PORTABLE RIGHT KNEE - 1-2 VIEW  Comparison: 12/22/2009  Findings: There are multiple antibiotic impregnated beads seen in the right knee joint.  Prosthesis remains in place, unchanged.  IMPRESSION: Antibiotic beads in place.  Original Report Authenticated By: Gwynn Burly, M.D.     Assessment/Plan: 1) Staph aureus bacteremia - now confirmed staph aureus.  TTE noted but a poor study.  With the knee infection she will need a prolonged course of antibiotics but I think a TEE would be useful in this patient due to the prolonged fever to assure no valve dysfunction, abscess or significant endocarditis so please do a TEE.    Awating sensitivities.    Dr. Ninetta Lights on for the weekend for any needs.  Thanks  V[facility] Fawaz Infectious Diseases 10/28/2011, 3:16 PM

## 2011-10-28 NOTE — Clinical Documentation Improvement (Signed)
CHANGE MENTAL STATUS DOCUMENTATION CLARIFICATION   THIS DOCUMENT IS NOT A PERMANENT PART OF THE MEDICAL RECORD  TO RESPOND TO THE THIS QUERY, FOLLOW THE INSTRUCTIONS BELOW:  1. If needed, update documentation for the patient's encounter via the notes activity.  2. Access this query again and click edit on the In [employer].  3. After updating, or not, click F2 to complete all highlighted (required) fields concerning your review. Select "additional documentation in the medical record" OR "no additional documentation provided".  4. Click Sign note button.  5. The deficiency will fall out of your In Basket *Please let us know if you are not able to complete this workflow by phone or e-mail (listed below).         10/28/11  Dear Dr. Maretta Bees Marton Redwood  In an effort to better capture your patient's severity of illness, reflect appropriate length of stay and utilization of resources, a review of the patient medical record has revealed the following indicators.    Based on your clinical judgment, please clarify and document in a progress note and/or discharge summary the clinical condition associated with the following supporting information:  In responding to this query please exercise your independent judgment.  The fact that a query is asked, does not imply that any particular answer is desired or expected.  Per note documenting "altered mental status"  Per ED provider note "EMS reports patient stable en route although somewhat confused" . Please clarify appropriate secondary diagnosis if known.  Thank you   Possible Clinical Conditions?  _____xx__Encephalopathy (describe type if known)                      Metabolic                      Toxic  _______Drug induced confusion/delirium  _______Acute confusion  _______Acute delirium  _______Other Condition__________________  _______Cannot Clinically Determine    Risk Factors: infected rt knee, DM type 2, age  Signs &  Symptoms: "EMS reports patient stable en route although somewhat confused"   Treatment: IV Vancomycin q 24hrs   Reviewed:   Thank You,  Leonette Most Addison  Clinical Documentation Specialist RN, BSN:  Pager [phone] HIM off [phone]  Health Information Management [place]

## 2011-10-29 ENCOUNTER — Encounter (HOSPITAL_COMMUNITY): Payer: Self-pay | Admitting: *Deleted

## 2011-10-29 DIAGNOSIS — M25569 Pain in unspecified knee: Secondary | ICD-10-CM

## 2011-10-29 LAB — WOUND CULTURE

## 2011-10-29 LAB — BASIC METABOLIC PANEL
Calcium: 9.5 mg/dL (ref 8.4–10.5)
GFR calc non Af Amer: 56 mL/min — ABNORMAL LOW (ref >90)
Glucose, Bld: 159 mg/dL — ABNORMAL HIGH (ref 70–99)
Sodium: 137 mEq/L (ref 135–145)

## 2011-10-29 LAB — CBC
Hemoglobin: 10.2 g/dL — ABNORMAL LOW (ref 12.0–15.0)
MCH: 30.8 pg (ref 26.0–34.0)
MCHC: 34.3 g/dL (ref 30.0–36.0)

## 2011-10-29 LAB — CULTURE, BLOOD (ROUTINE X 2): Culture  Setup Time: 201303061403

## 2011-10-29 LAB — GLUCOSE, CAPILLARY: Glucose-Capillary: 163 mg/dL — ABNORMAL HIGH (ref 70–99)

## 2011-10-29 LAB — PROTIME-INR
INR: 1.21 (ref 0.00–1.49)
Prothrombin Time: 15.6 s — ABNORMAL HIGH (ref 11.6–15.2)

## 2011-10-29 LAB — TSH: TSH: 6.494 u[IU]/mL — ABNORMAL HIGH (ref 0.350–4.500)

## 2011-10-29 MED ORDER — METOPROLOL SUCCINATE ER 50 MG PO TB24
50.0000 mg | ORAL_TABLET | Freq: Two times a day (BID) | ORAL | Status: DC
  Administered 2011-10-29 – 2011-10-30 (×2): 50 mg via ORAL
  Filled 2011-10-29 (×3): qty 1

## 2011-10-29 MED ORDER — FUROSEMIDE 10 MG/ML IJ SOLN
40.0000 mg | Freq: Once | INTRAMUSCULAR | Status: AC
  Administered 2011-10-29: 40 mg via INTRAVENOUS
  Filled 2011-10-29: qty 4

## 2011-10-29 MED ORDER — FUROSEMIDE 40 MG PO TABS
40.0000 mg | ORAL_TABLET | Freq: Every day | ORAL | Status: DC
  Administered 2011-10-29 – 2011-11-02 (×5): 40 mg via ORAL
  Filled 2011-10-29 (×5): qty 1

## 2011-10-29 MED ORDER — WARFARIN SODIUM 7.5 MG PO TABS
7.5000 mg | ORAL_TABLET | Freq: Once | ORAL | Status: AC
  Administered 2011-10-29: 7.5 mg via ORAL
  Filled 2011-10-29: qty 1

## 2011-10-29 MED ORDER — INSULIN ASPART 100 UNIT/ML ~~LOC~~ SOLN
0.0000 [IU] | Freq: Every day | SUBCUTANEOUS | Status: DC
  Administered 2011-10-29: 0 [IU] via SUBCUTANEOUS

## 2011-10-29 MED ORDER — WHITE PETROLATUM GEL
Status: AC
  Administered 2011-10-29: 01:00:00
  Filled 2011-10-29: qty 5

## 2011-10-29 MED ORDER — INSULIN ASPART 100 UNIT/ML ~~LOC~~ SOLN
0.0000 [IU] | Freq: Three times a day (TID) | SUBCUTANEOUS | Status: DC
  Administered 2011-10-29: 4 [IU] via SUBCUTANEOUS
  Administered 2011-10-30: 7 [IU] via SUBCUTANEOUS
  Administered 2011-10-30 – 2011-11-01 (×6): 4 [IU] via SUBCUTANEOUS
  Administered 2011-11-01: 3 [IU] via SUBCUTANEOUS
  Administered 2011-11-01 – 2011-11-02 (×3): 4 [IU] via SUBCUTANEOUS

## 2011-10-29 MED ORDER — SODIUM CHLORIDE 0.9 % IJ SOLN
INTRAMUSCULAR | Status: AC
  Administered 2011-10-29: 10 mL
  Filled 2011-10-29: qty 10

## 2011-10-29 MED ORDER — INSULIN GLARGINE 100 UNIT/ML ~~LOC~~ SOLN
20.0000 [IU] | Freq: Every day | SUBCUTANEOUS | Status: DC
  Administered 2011-10-29: 20 [IU] via SUBCUTANEOUS

## 2011-10-29 MED ORDER — CEFAZOLIN SODIUM 1-5 GM-% IV SOLN
1.0000 g | Freq: Three times a day (TID) | INTRAVENOUS | Status: DC
  Administered 2011-10-29 – 2011-11-01 (×9): 1 g via INTRAVENOUS
  Filled 2011-10-29 (×12): qty 50

## 2011-10-29 NOTE — Progress Notes (Addendum)
[facility] progress note. Chief complaint. Dyspnea. History of present illness. 77 year old female with right knee septic arthritis status post I and D. Blood cultures have also returned positive for staph. She has a history of sleep apnea but declines use of CPAP. She is noted by staff to be dyspneic with a respiratory rate currently 25-30. Patient subjectively states that she feels somewhat dyspneic as well. I questioned her regarding prior cardiac history and she says in fact she has a history of congestive heart failure. She was on Lasix at home. A chest x-ray was obtained and this indicates vascular congestion and mild edema pattern not excluded. She has been on  IV fluids 125 cc per hour due to a component of dehydration. Vital signs. Temperature 100.2, pulse 103, respiration 28, blood pressure 128/66. O2 sats 99%. Gen. appearance. Obese female in no distress. She is alert and cooperative. He is able to speak in full sentences without dyspnea. Cardiac. Regular rhythm and mildly tachycardic rate. She does have some jugular venous distention. She has no peripheral edema as well. She generally looks puffy. Lungs. Diminished midlung to base bilaterally but more so on the right. Few scattered crackles. Some increase work of breathing but no distress. Stable O2 sats. Abdomen. Soft and obese with positive bowel sounds. No pain. Extremities. Chest 2-3+ edema of the foot to knee though assessment difficult to to right leg immobilizer. Impression/plan. Problem #1 dyspnea. Suspect there is a degree of congestive heart failure. I have decreased her fluids to [facility] and will give her a one-time dose of Lasix 40 mg IV. We'll follow with a B. natruretic peptide with a.m. Labs.  Note - pt has had echo this admit confirming NORMAL systolic fxn/EF with no comment on ? Diastolic dysfxn

## 2011-10-29 NOTE — Progress Notes (Addendum)
[facility] [place] TEAM 1 - Stepdown/ICU TEAM  Subjective: 77 year old female with known history of type 2 diabetes mellitus, hypothyroidism, hypercholesterolemia, obstructive sleep apnea, hypertension, status post right total knee arthroplasty with subsequent infection and extraction 2/25 2011 and subsequent reimplantation of right total knee spacer 12/22/09 presented to [facility] where her knee joint was aspirated. The aspirate showed 5,000+ WBC and gram positive cocci. She was then sent to [facility] for definitive treatment, where she was admitted by the Orthopedic service.    She had some difficulty w/ sob last night.  This has resolved at present.  She is up in a chair, and in good spirits. She denies chills, sob, n/v, abdom pain, or cp at present.    Objective: Weight change:   Intake/Output Summary (Last 24 hours) at 10/29/11 1301 Last data filed at 10/29/11 0500  Gross per 24 hour  Intake   1424 ml  Output   2550 ml  Net  -1126 ml   Blood pressure 147/66, pulse 108, temperature 100.7 F (38.2 C), temperature source Rectal, resp. rate 24, height 5\' 6"  (1.676 m), weight 129.4 kg (285 lb 4.4 oz), SpO2 99.00%.  Physical Exam: General: No acute respiratory distress Lungs: Clear to auscultation bilaterally without wheezes  Cardiovascular: tachycardic but with regular rhythm without murmur gallop or rub  Abdomen: morbidly obese, nontender, nondistended, soft, bowel sounds positive, no rebound, no ascites, no appreciable mass Extremities: 1+ B LE edema - R LE in knee immobilizer   Lab Results:  Basename 10/29/11 0310 10/28/11 0425 10/27/11 0103  NA 137 138 142  K 3.6 3.9 3.7  CL 99 105 102  CO2 25 25 27   GLUCOSE 159* 135* 191*  BUN 17 23 39*  CREATININE 0.97 1.22* 1.78*  CALCIUM 9.5 8.5 9.0  MG -- -- --  PHOS -- -- --    Basename 10/29/11 0310 10/28/11 0425 10/27/11 0103  WBC 5.6 4.7 9.1  NEUTROABS -- -- 7.9*  HGB 10.2* 9.7* 10.9*  HCT 29.7* 29.4*  32.5*  MCV 89.7 91.9 92.1  PLT 133* 122* 128*   Micro Results: Recent Results (from the past 240 hour(s))  CULTURE, BLOOD (ROUTINE X 2)     Status: Normal   Collection Time   10/26/11 11:00 AM      Component Value Range Status Comment   Specimen Description BLOOD ARM RIGHT   Final    Special Requests BOTTLES DRAWN AEROBIC ONLY 10 CC    Final    Culture  Setup Time [id number]   Final    Culture     Final    Value: STAPHYLOCOCCUS AUREUS     Note: SUSCEPTIBILITIES PERFORMED ON PREVIOUS CULTURE WITHIN THE LAST 5 DAYS.     Note: Gram Stain Report Called to,Read Back By and Verified With: LACY HITT @0401  ON 10/27/2011 BY MCLET   Report Status 10/29/2011 FINAL   Final   CULTURE, BLOOD (ROUTINE X 2)     Status: Normal   Collection Time   10/26/11 11:10 AM      Component Value Range Status Comment   Specimen Description BLOOD HAND RIGHT   Final    Special Requests BOTTLES DRAWN AEROBIC AND ANAEROBIC [facility]   Final    Culture  Setup Time [id number]   Final    Culture     Final    Value: STAPHYLOCOCCUS AUREUS     Note: RIFAMPIN AND GENTAMICIN SHOULD NOT BE USED AS SINGLE DRUGS FOR TREATMENT OF STAPH  INFECTIONS.     Note: Gram Stain Report Called to,Read Back By and Verified With: LACY HITT @ 0300 ON 10/27/2011 BY MCLET   Report Status 10/29/2011 FINAL   Final    Organism ID, Bacteria STAPHYLOCOCCUS AUREUS   Final   MRSA PCR SCREENING     Status: Normal   Collection Time   10/26/11 11:17 AM      Component Value Range Status Comment   MRSA by PCR NEGATIVE  NEGATIVE  Final   WOUND CULTURE     Status: Normal   Collection Time   10/26/11  7:29 PM      Component Value Range Status Comment   Specimen Description WOUND KNEE RIGHT   Final    Special Requests NONE   Final    Gram Stain     Final    Value: MODERATE WBC PRESENT, PREDOMINANTLY PMN     NO SQUAMOUS EPITHELIAL CELLS SEEN     RARE GRAM POSITIVE COCCI IN PAIRS   Culture     Final    Value: ABUNDANT STAPHYLOCOCCUS AUREUS     Note:  RIFAMPIN AND GENTAMICIN SHOULD NOT BE USED AS SINGLE DRUGS FOR TREATMENT OF STAPH INFECTIONS. This organism is presumed to be Clindamycin resistant based on detection of inducible Clindamycin resistance.   Report Status 10/29/2011 FINAL   Final    Organism ID, Bacteria STAPHYLOCOCCUS AUREUS   Final   ANAEROBIC CULTURE     Status: Normal (Preliminary result)   Collection Time   10/26/11  7:29 PM      Component Value Range Status Comment   Specimen Description WOUND KNEE RIGHT   Final    Special Requests NONE   Final    Gram Stain     Final    Value: MODERATE WBC PRESENT, PREDOMINANTLY PMN     NO SQUAMOUS EPITHELIAL CELLS SEEN     RARE GRAM POSITIVE COCCI IN PAIRS   Culture     Final    Value: NO ANAEROBES ISOLATED; CULTURE IN PROGRESS FOR 5 DAYS   Report Status PENDING   Incomplete   CULTURE, BLOOD (ROUTINE X 2)     Status: Normal (Preliminary result)   Collection Time   10/28/11  3:30 PM      Component Value Range Status Comment   Specimen Description BLOOD LEFT HAND   Final    Special Requests BOTTLES DRAWN AEROBIC ONLY 5CC   Final    Culture  Setup Time [id number]   Final    Culture     Final    Value:        BLOOD CULTURE RECEIVED NO GROWTH TO DATE CULTURE WILL BE HELD FOR 5 DAYS BEFORE ISSUING A FINAL NEGATIVE REPORT   Report Status PENDING   Incomplete   CULTURE, BLOOD (ROUTINE X 2)     Status: Normal (Preliminary result)   Collection Time   10/28/11  3:35 PM      Component Value Range Status Comment   Specimen Description BLOOD LEFT HAND   Final    Special Requests BOTTLES DRAWN AEROBIC ONLY 3CC   Final    Culture  Setup Time [id number]   Final    Culture     Final    Value:        BLOOD CULTURE RECEIVED NO GROWTH TO DATE CULTURE WILL BE HELD FOR 5 DAYS BEFORE ISSUING A FINAL NEGATIVE REPORT   Report Status PENDING   Incomplete     Studies/Results: All  recent x-ray/radiology reports have been reviewed in detail.   Medications: I have reviewed the patient's complete  medication list.  Reccomendations:  R knee septic arthritis (recurrent) S/p I&D w/ poly exchange - care as per primary service  Blood cx+ 2/2 MSSA  As per ID, must consider possibility of SBE - to complete prolonged course of abx for above, so TEE will not add/change our tx plan - TTE is w/o evidence of large vegetations, but was of poor quality   Altered mental status/Toxic Metabolic Encephalopathy Appears to be at baseline MS today  Tachycardia Improved w/ volume expansion - follow  Hypercholesterolemia Resume tx prior to d/c once BP stable   Tobacco abuse Counseling ongoing   Sleep apnea Pt is noncompliant w/ CPAP/does not use - will cont to educate her on need to comply and serious implications   HTN reasonably controlled at present - follow trend w/ hydration  Hypothyroidism? No evidence that she is on hormone replacement as outpt - TSH and FT4 are normal  DM w/ neuropathy Trending upward - adjust tx plan and follow  DVT prophy As ordered by Ortho  Dispo We will continue to follow along with you  Lonia Blood, MD [facility] Office  [phone] Pager [phone]  On-Call/Text Page:      [email or web address]      password [facility]

## 2011-10-29 NOTE — Progress Notes (Addendum)
Subjective: 3 Days Post-Op Procedure(s) (LRB): IRRIGATION AND DEBRIDEMENT KNEE WITH POLY EXCHANGE (Right) Patient reports pain as moderate.    Objective: Vital signs in last 24 hours: Temp:  [99 F (37.2 C)-101.5 F (38.6 C)] 100.7 F (38.2 C) (03/09 0401) Pulse Rate:  [101-122] 108  (03/09 0400) Resp:  [24-43] 24  (03/09 0400) BP: (120-147)/(63-73) 147/66 mmHg (03/09 0400) SpO2:  [88 %-100 %] 99 % (03/09 0400)  Intake/Output from previous day: 03/08 0701 - 03/09 0700 In: 1904 [P.O.:960; I.V.:940; IV Piggyback:4] Out: 3000 [Urine:3000] Intake/Output this shift:     Basename 10/29/11 0310 10/28/11 0425 10/27/11 0103 10/26/11 1015  HGB 10.2* 9.7* 10.9* 11.6*    Basename 10/29/11 0310 10/28/11 0425  WBC 5.6 4.7  RBC 3.31* 3.20*  HCT 29.7* 29.4*  PLT 133* 122*    Basename 10/29/11 0310 10/28/11 0425  NA 137 138  K 3.6 3.9  CL 99 105  CO2 25 25  BUN 17 23  CREATININE 0.97 1.22*  GLUCOSE 159* 135*  CALCIUM 9.5 8.5    Basename 10/29/11 0310 10/28/11 0425  LABPT -- --  INR 1.21 1.18    Neurologically intact  Assessment/Plan: 3 Days Post-Op Procedure(s) (LRB): IRRIGATION AND DEBRIDEMENT KNEE WITH POLY EXCHANGE (Right) Up with therapy     Cultures show MSSA .  Cont AV ABX     Should be able to change to Ancef/Cefadyl.   Sanita Estrada C 10/29/2011, 10:10 AM

## 2011-10-29 NOTE — Evaluation (Signed)
Physical Therapy Evaluation Patient Details Name: Catherine [name] MRN: [id number] DOB: Jul 21, 1935 Today's Date: 10/29/2011  Problem List:  Patient Active Problem List  Diagnoses  . HYPOTHYROIDISM  . DIABETES MELLITUS, TYPE II, WITH NEUROLOGICAL COMPLICATIONS  . VITAMIN D DEFICIENCY  . HYPERCHOLESTEROLEMIA  . TOBACCO ABUSE  . EATING DISORDER  . OBSTRUCTIVE SLEEP APNEA  . SLEEP RELATED HYPOVENTILATION/HYPOXEMIA CCE  . HYPERTENSION  . GERD  . DERMATITIS DUE DRUGS&MEDICINES TAKEN INTERNALLY  . OSTEOARTHRITIS  . Sciatica  . LATERAL EPICONDYLITIS  . FASCIITIS, PLANTAR  . URINARY INCONTINENCE  . OTH COMPLICATIONS DUE INTERNAL JOINT PROSTHESIS  . Infection of total right knee replacement  . Anemia  . Tachycardia    Past Medical History:  Past Medical History  Diagnosis Date  . Sleep apnea   . Hypertension   . Hypothyroidism   . GERD (gastroesophageal reflux disease)   . Arthritis   . Anxiety   . Diabetes mellitus    Past Surgical History:  Past Surgical History  Procedure Date  . I&d knee with poly exchange 10/26/2011    Procedure: IRRIGATION AND DEBRIDEMENT KNEE WITH POLY EXCHANGE;  Surgeon: Cammy Copa, MD;  Location: [facility] OR;  Service: Orthopedics;  Laterality: Right;  with placement of antibiotic stimulan beads    PT Assessment/Plan/Recommendation Clinical Impression Statement: Pt s/p I & D of Rt TKR with poly-exchange with post-op fever/sepsis/hypotension/tachycardia and now with dependencies with all mobility due to generalized weakness.  Will benefit from PT to maximize safety and independence with mobility and assist with d/c planning to least restrictive environment.  Note: no ROM exercises to Rt knee will be done until OK and ordered by MD. PT Recommendation/Assessment: Patient will need skilled PT in the acute care venue PT Problem List: Decreased strength;Decreased mobility;Decreased knowledge of use of DME;Cardiopulmonary status limiting activity;Impaired  sensation;Obesity Barriers to Discharge: Decreased caregiver support PT Therapy Diagnosis : Difficulty walking;Generalized weakness PT Frequency: Min 5X/week PT Treatment/Interventions: DME instruction;Gait training;Stair training;Functional mobility training;Therapeutic activities;Therapeutic exercise;Patient/family education Recommendations for Other Services: OT consult;Rehab consult Follow Up Recommendations: Inpatient Rehab Equipment Recommended: Defer to next venue PT Goals  Acute Rehab PT Goals PT Goal Formulation: With patient Time For Goal Achievement: 2 weeks Pt will go Supine/Side to Sit: with supervision;with HOB not 0 degrees (comment degree);with rail PT Goal: Supine/Side to Sit - Progress: Goal set today Pt will go Sit to Supine/Side: with supervision;with rail;with HOB not 0 degrees (comment degree) PT Goal: Sit to Supine/Side - Progress: Goal set today Pt will go Sit to Stand: with supervision;with upper extremity assist PT Goal: Sit to Stand - Progress: Goal set today Pt will go Stand to Sit: with supervision;with upper extremity assist PT Goal: Stand to Sit - Progress: Goal set today Pt will Ambulate: 51 - 150 feet;with supervision;with least restrictive assistive device PT Goal: Ambulate - Progress: Goal set today Pt will Go Up / Down Stairs: 1-2 stairs;with min assist;with least restrictive assistive device PT Goal: Up/Down Stairs - Progress: Goal set today  PT Evaluation Precautions/Restrictions  Precautions Precautions: Knee Precaution Booklet Issued: No Precaution Comments: No ROM Rt knee unless cleared by MD Required Braces or Orthoses: Yes Knee Immobilizer: On at all times Restrictions Weight Bearing Restrictions: Yes RLE Weight Bearing: Weight bearing as tolerated Prior Functioning  Home Living Lives With: Alone Receives Help From: Family (2 sons live nearby; were not helping PTA) Type of Home: House Home Layout: One level Home Access: Stairs to  enter Entrance Stairs-Rails: None (4x4 post)  Entrance Stairs-Number of Steps: 1 Bathroom Shower/Tub: Walk-in Contractor: Handicapped height Home Adaptive Equipment: Clinical research associate - four wheeled;Bedside commode/3-in-1;Grab bars in shower;Wheelchair - manual Additional Comments: uses cane inside; RW outside; walker does not have seat; 3n1 cannot fit inside shower--wants to get smaller shower seat Prior Function Level of Independence: Independent with basic ADLs;Independent with gait;Requires assistive device for independence Driving: Yes Comments: pt reports she has slept in a recliner for years due to difficulty getting in/out of bed Cognition Cognition Arousal/Alertness: Awake/alert Overall Cognitive Status: Appears within functional limits for tasks assessed Orientation Level: Oriented to person;Oriented to place;Oriented to situation Cognition - Other Comments: time not tested Sensation/Coordination Sensation Light Touch: Impaired Detail Light Touch Impaired Details: Impaired RLE;Impaired LLE Additional Comments: bil LE polyneuropathy; hypersensitivity Extremity Assessment RLE Assessment RLE Assessment: Exceptions to [facility] RLE AROM (degrees) RLE Overall AROM Comments: ankle WFL; knee not tested due to KI and no orders to perform ROM; hip grossly WFL (sitting EOB) RLE Strength RLE Overall Strength Comments: hip flexors <2+/5; ankle DF 5/5 LLE Assessment LLE Assessment: Within Functional Limits (grossly) Mobility (including Balance) Bed Mobility Supine to Sit: 1: +2 Total assist;Patient percentage (comment);HOB elevated (Comment degrees);With rails Supine to Sit Details (indicate cue type and reason): HOB 60, exiting bed to Lt; pt = 60% (assist to manage RLE and raise torso) Sitting - Scoot to Edge of Bed: 4: Min assist;With rail Sitting - Scoot to [place] of Bed Details (indicate cue type and reason): assist to hold RLE elevated while scooting forward to  enable pt to rest Rt foot on floor with KI Transfers Sit to Stand: 1: +2 Total assist;Patient percentage (comment);With upper extremity assist;From bed Sit to Stand Details (indicate cue type and reason): bed at lowest height, using rail on Lt side; pt = 60%; assist to transfer anteriorly and vertically Stand to Sit: 1: +2 Total assist;With upper extremity assist;With armrests;To chair/3-in-1;Patient percentage (comment) Stand to Sit Details: pt= 80%;  Ambulation/Gait Ambulation/Gait: Yes Ambulation/Gait Assistance: 1: +2 Total assist;Patient percentage (comment) Ambulation/Gait Assistance Details (indicate cue type and reason): pt = 70 %; pt initially attempting to "scoot/shimmy" Lt foot along floor to move bed to chair; with cues able to bear enough weight through UEs to step each leg to perform stand-pivot with RW; pt required incr assist to move/position RW Ambulation Distance (Feet): 2 Feet Assistive device: Rolling walker Gait Pattern: Step-to pattern;Decreased hip/knee flexion - right;Decreased weight shift to right;Right hip hike;Shuffle    Exercise  Total Joint Exercises Ankle Circles/Pumps: AROM;Right;10 reps;Supine End of Session PT - End of Session Equipment Utilized During Treatment: Gait belt;Right knee immobilizer Activity Tolerance: Patient tolerated treatment well Patient left: in chair;with call bell in reach Nurse Communication: Mobility status for transfers General Behavior During Session: [facility] for tasks performed  Aaleigha Bozza 10/29/2011, 1:53 PM  Pager [phone]

## 2011-10-29 NOTE — Progress Notes (Signed)
INFECTIOUS DISEASE PROGRESS NOTE  ID: Catherine [name] is a 77 y.o. female with   Principal Problem:  *Infection of total right knee replacement Active Problems:  HYPOTHYROIDISM  DIABETES MELLITUS, TYPE II, WITH NEUROLOGICAL COMPLICATIONS  OBSTRUCTIVE SLEEP APNEA  HYPERTENSION  GERD  Sciatica  Anemia  Tachycardia  Subjective: C/o pain in knee  Abtx:  Anti-infectives     Start     Dose/Rate Route Frequency Ordered Stop   10/28/11 1800   rifampin (RIFADIN) capsule 600 mg        600 mg Oral Daily-1800 10/28/11 1556     10/27/11 1500   vancomycin (VANCOCIN) 1,750 mg in sodium chloride 0.9 % 500 mL IVPB        1,750 mg 250 mL/hr over 120 Minutes Intravenous Every 24 hours 10/26/11 1345     10/26/11 1853   vancomycin (VANCOCIN) powder  Status:  Discontinued          As needed 10/26/11 1916 10/26/11 2015   10/26/11 1850   gentamicin (GARAMYCIN) injection  Status:  Discontinued          As needed 10/26/11 1853 10/26/11 2015   10/26/11 1730   vancomycin (VANCOCIN) powder 1,000 mg  Status:  Discontinued        1,000 mg Other To Surgery 10/26/11 1721 10/26/11 2233   10/26/11 1500   vancomycin (VANCOCIN) 500 mg in sodium chloride 0.9 % 100 mL IVPB        500 mg 100 mL/hr over 60 Minutes Intravenous  Once 10/26/11 1345 10/26/11 1702   10/26/11 1030   vancomycin (VANCOCIN) 1,250 mg in sodium chloride 0.9 % 250 mL IVPB  Status:  Discontinued        1,250 mg 166.7 mL/hr over 90 Minutes Intravenous Every 12 hours 10/26/11 0929 10/26/11 1345          Medications:  Scheduled:   . furosemide  40 mg Intravenous Once  . insulin aspart  0-9 Units Subcutaneous TID AC & HS  . insulin glargine  10 Units Subcutaneous QHS  . metoprolol succinate  25 mg Oral BID  . patient's guide to using coumadin book   Does not apply Once  . rifampin  600 mg Oral q1800  . sodium chloride      . sodium chloride      . sodium chloride      . vancomycin  1,750 mg Intravenous Q24H  . warfarin  5 mg  Oral Once  . warfarin  7.5 mg Oral ONCE-1800  . warfarin  7.5 mg Oral ONCE-1800  . Warfarin - Pharmacist Dosing Inpatient   Does not apply q1800  . white petrolatum        Objective: Vital signs in last 24 hours: Temp:  [99 F (37.2 C)-101.5 F (38.6 C)] 100.7 F (38.2 C) (03/09 0401) Pulse Rate:  [101-122] 108  (03/09 0400) Resp:  [24-43] 24  (03/09 0400) BP: (120-147)/(63-73) 147/66 mmHg (03/09 0400) SpO2:  [88 %-100 %] 99 % (03/09 0400)   General appearance: alert, cooperative and mild distress  Lab Results  Basename 10/29/11 0310 10/28/11 0425  WBC 5.6 4.7  HGB 10.2* 9.7*  HCT 29.7* 29.4*  NA 137 138  K 3.6 3.9  CL 99 105  CO2 25 25  BUN 17 23  CREATININE 0.97 1.22*  GLU -- --   Liver Panel No results found for this basename: PROT:2,ALBUMIN:2,AST:2,ALT:2,ALKPHOS:2,BILITOT:2,BILIDIR:2,IBILI:2 in the last 72 hours Sedimentation Rate  Basename 10/27/11 0103  ESRSEDRATE  50*   C-Reactive Protein No results found for this basename: CRP:2 in the last 72 hours  Microbiology: Recent Results (from the past 240 hour(s))  CULTURE, BLOOD (ROUTINE X 2)     Status: Normal   Collection Time   10/26/11 11:00 AM      Component Value Range Status Comment   Specimen Description BLOOD ARM RIGHT   Final    Special Requests BOTTLES DRAWN AEROBIC ONLY 10 CC    Final    Culture  Setup Time [id number]   Final    Culture     Final    Value: STAPHYLOCOCCUS AUREUS     Note: SUSCEPTIBILITIES PERFORMED ON PREVIOUS CULTURE WITHIN THE LAST 5 DAYS.     Note: Gram Stain Report Called to,Read Back By and Verified With: LACY HITT @0401  ON 10/27/2011 BY MCLET   Report Status 10/29/2011 FINAL   Final   CULTURE, BLOOD (ROUTINE X 2)     Status: Normal   Collection Time   10/26/11 11:10 AM      Component Value Range Status Comment   Specimen Description BLOOD HAND RIGHT   Final    Special Requests BOTTLES DRAWN AEROBIC AND ANAEROBIC 10CC EACH   Final    Culture  Setup Time [id number]    Final    Culture     Final    Value: STAPHYLOCOCCUS AUREUS     Note: RIFAMPIN AND GENTAMICIN SHOULD NOT BE USED AS SINGLE DRUGS FOR TREATMENT OF STAPH INFECTIONS.     Note: Gram Stain Report Called to,Read Back By and Verified With: LACY HITT @ 0300 ON 10/27/2011 BY MCLET   Report Status 10/29/2011 FINAL   Final    Organism ID, Bacteria STAPHYLOCOCCUS AUREUS   Final   MRSA PCR SCREENING     Status: Normal   Collection Time   10/26/11 11:17 AM      Component Value Range Status Comment   MRSA by PCR NEGATIVE  NEGATIVE  Final   WOUND CULTURE     Status: Normal   Collection Time   10/26/11  7:29 PM      Component Value Range Status Comment   Specimen Description WOUND KNEE RIGHT   Final    Special Requests NONE   Final    Gram Stain     Final    Value: MODERATE WBC PRESENT, PREDOMINANTLY PMN     NO SQUAMOUS EPITHELIAL CELLS SEEN     RARE GRAM POSITIVE COCCI IN PAIRS   Culture     Final    Value: ABUNDANT STAPHYLOCOCCUS AUREUS     Note: RIFAMPIN AND GENTAMICIN SHOULD NOT BE USED AS SINGLE DRUGS FOR TREATMENT OF STAPH INFECTIONS. This organism is presumed to be Clindamycin resistant based on detection of inducible Clindamycin resistance.   Report Status 10/29/2011 FINAL   Final    Organism ID, Bacteria STAPHYLOCOCCUS AUREUS   Final   ANAEROBIC CULTURE     Status: Normal (Preliminary result)   Collection Time   10/26/11  7:29 PM      Component Value Range Status Comment   Specimen Description WOUND KNEE RIGHT   Final    Special Requests NONE   Final    Gram Stain     Final    Value: MODERATE WBC PRESENT, PREDOMINANTLY PMN     NO SQUAMOUS EPITHELIAL CELLS SEEN     RARE GRAM POSITIVE COCCI IN PAIRS   Culture     Final    Value: NO  ANAEROBES ISOLATED; CULTURE IN PROGRESS FOR 5 DAYS   Report Status PENDING   Incomplete   CULTURE, BLOOD (ROUTINE X 2)     Status: Normal (Preliminary result)   Collection Time   10/28/11  3:30 PM      Component Value Range Status Comment   Specimen Description  BLOOD LEFT HAND   Final    Special Requests BOTTLES DRAWN AEROBIC ONLY 5CC   Final    Culture  Setup Time [id number]   Final    Culture     Final    Value:        BLOOD CULTURE RECEIVED NO GROWTH TO DATE CULTURE WILL BE HELD FOR 5 DAYS BEFORE ISSUING A FINAL NEGATIVE REPORT   Report Status PENDING   Incomplete   CULTURE, BLOOD (ROUTINE X 2)     Status: Normal (Preliminary result)   Collection Time   10/28/11  3:35 PM      Component Value Range Status Comment   Specimen Description BLOOD LEFT HAND   Final    Special Requests BOTTLES DRAWN AEROBIC ONLY 3CC   Final    Culture  Setup Time [id number]   Final    Culture     Final    Value:        BLOOD CULTURE RECEIVED NO GROWTH TO DATE CULTURE WILL BE HELD FOR 5 DAYS BEFORE ISSUING A FINAL NEGATIVE REPORT   Report Status PENDING   Incomplete     Studies/Results: Dg Chest Port 1 View  10/28/2011  *RADIOLOGY REPORT*  Clinical Data: Dyspnea  PORTABLE CHEST - 1 VIEW  Comparison: 10/26/2011  Findings: Cardiomegaly.  Central vascular congestion.  Right IJ central venous catheter tip projects over the proximal SVC.  Mild bibasilar linear opacities. Mild interstitial prominence may be exaggerated by technique or represent mild edema pattern.  Cannot exclude small pleural effusions.  No pneumothorax.  No acute osseous abnormality within limitations of technique.  IMPRESSION: Cardiomegaly.  Central vascular congestion. Mild edema pattern not excluded.  Bibasilar opacities, likely atelectasis.  Original Report Authenticated By: Waneta Martins, M.D.     Assessment/Plan: Osteomyelitis R TKR MSSA (R-PEN, Clinda, Erythro) S/P I & D, exchange of poly 10-26-11 Rifampin Day 2 Vanco Day 4 Will change her to ancef/rifampin.     Johny Sax Infectious Diseases [phone] 10/29/2011, 11:35 AM

## 2011-10-29 NOTE — Progress Notes (Signed)
ANTICOAGULATION CONSULT NOTE - Follow Up Consult  Pharmacy Consult for Coumadin Indication: VTE prophylaxis  Allergies  Allergen Reactions  . Gabapentin     REACTION: rash, diarrhea    Patient Measurements: Height: 5\' 6"  (167.6 cm) Weight: 285 lb 4.4 oz (129.4 kg) IBW/kg (Calculated) : 59.3  Heparin Dosing Weight:   Vital Signs: Temp: 98.9 F (37.2 C) (03/09 1200) Temp src: Oral (03/09 1200) BP: 158/88 mmHg (03/09 1230) Pulse Rate: 138  (03/09 1230)  Labs:  Basename 10/29/11 0310 10/28/11 0425 10/27/11 0430 10/27/11 0103  HGB 10.2* 9.7* -- --  HCT 29.7* 29.4* -- 32.5*  PLT 133* 122* -- 128*  APTT -- -- -- --  LABPROT 15.6* 15.3* 16.7* --  INR 1.21 1.18 1.33 --  HEPARINUNFRC -- -- -- --  CREATININE 0.97 1.22* -- 1.78*  CKTOTAL -- -- -- --  CKMB -- -- -- --  TROPONINI -- -- -- --   Estimated Creatinine Clearance: 70.1 ml/min (by C-G formula based on Cr of 0.97).  Assessment: 76yof s/p TKA on Coumadin for VTE prophylaxis. INR (1.21) is subtherapeutic. Coumadin has been dosed since 3/6 but patient has only received 1 dose (7.5mg  received 3/8, 3/6 dose not given due to lethargy, 3/7 dose not charted given). Pt is also receiving Rifampin which increases the metabolism of warfarin thus decreasing the INR. Will repeat Coumadin 7.5mg  dose (increased dose anticipating Rifampin interaction) - H/H and Plts improving - No significant bleeding reported  Goal of Therapy:  INR 2-3   Plan:  1. Coumadin 7.5mg  po x 1 today 2. Follow-up AM INR  Cleon Dew [phone] 10/29/2011,2:54 PM

## 2011-10-30 LAB — GLUCOSE, CAPILLARY
Glucose-Capillary: 187 mg/dL — ABNORMAL HIGH (ref 70–99)
Glucose-Capillary: 190 mg/dL — ABNORMAL HIGH (ref 70–99)

## 2011-10-30 LAB — BASIC METABOLIC PANEL
BUN: 16 mg/dL (ref 6–23)
Chloride: 99 mEq/L (ref 96–112)
Creatinine, Ser: 0.93 mg/dL (ref 0.50–1.10)
GFR calc Af Amer: 68 mL/min — ABNORMAL LOW (ref >90)
Glucose, Bld: 179 mg/dL — ABNORMAL HIGH (ref 70–99)

## 2011-10-30 LAB — CBC
HCT: 29.3 % — ABNORMAL LOW (ref 36.0–46.0)
MCH: 30.5 pg (ref 26.0–34.0)
MCHC: 34.1 g/dL (ref 30.0–36.0)
MCV: 89.3 fL (ref 78.0–100.0)
RDW: 14 % (ref 11.5–15.5)

## 2011-10-30 LAB — PROTIME-INR: Prothrombin Time: 19.2 seconds — ABNORMAL HIGH (ref 11.6–15.2)

## 2011-10-30 MED ORDER — SODIUM CHLORIDE 0.9 % IJ SOLN
INTRAMUSCULAR | Status: AC
  Administered 2011-10-30: 10 mL
  Filled 2011-10-30: qty 20

## 2011-10-30 MED ORDER — WARFARIN SODIUM 6 MG PO TABS
6.0000 mg | ORAL_TABLET | Freq: Once | ORAL | Status: AC
  Administered 2011-10-30: 6 mg via ORAL
  Filled 2011-10-30 (×2): qty 1

## 2011-10-30 MED ORDER — METOPROLOL SUCCINATE ER 100 MG PO TB24
100.0000 mg | ORAL_TABLET | Freq: Two times a day (BID) | ORAL | Status: DC
  Administered 2011-10-31 – 2011-11-02 (×5): 100 mg via ORAL
  Filled 2011-10-30 (×8): qty 1

## 2011-10-30 MED ORDER — SODIUM CHLORIDE 0.9 % IJ SOLN
INTRAMUSCULAR | Status: AC
  Administered 2011-10-30: 10 mL
  Filled 2011-10-30: qty 10

## 2011-10-30 MED ORDER — POTASSIUM CHLORIDE CRYS ER 20 MEQ PO TBCR
40.0000 meq | EXTENDED_RELEASE_TABLET | ORAL | Status: AC
  Administered 2011-10-30 – 2011-10-31 (×3): 40 meq via ORAL
  Filled 2011-10-30 (×4): qty 2

## 2011-10-30 MED ORDER — INSULIN GLARGINE 100 UNIT/ML ~~LOC~~ SOLN
30.0000 [IU] | Freq: Every day | SUBCUTANEOUS | Status: DC
  Administered 2011-10-30 – 2011-11-01 (×3): 30 [IU] via SUBCUTANEOUS

## 2011-10-30 NOTE — Progress Notes (Signed)
Physical Therapy Treatment Patient Details Name: Catherine [name] MRN: [id number] DOB: July 29, 1935 Today's Date: 10/30/2011  PT Assessment/Plan  PT - Assessment/Plan Comments on Treatment Session: Progressed with increased gait distance and less assistance needed with mobility today. MD rounding today said no knee ROM due to pt having beads in right knee. PT Plan: Discharge plan remains appropriate;Frequency remains appropriate PT Frequency: Min 5X/week Recommendations for Other Services: OT consult;Rehab consult Follow Up Recommendations: Inpatient Rehab Equipment Recommended: Defer to next venue PT Goals  Acute Rehab PT Goals PT Goal: Sit to Stand - Progress: Progressing toward goal PT Goal: Stand to Sit - Progress: Progressing toward goal PT Goal: Ambulate - Progress: Progressing toward goal  PT Treatment Precautions/Restrictions  Precautions Precautions: Knee Precaution Booklet Issued: No Precaution Comments: No ROM of rt knee. Required Braces or Orthoses: Yes Knee Immobilizer: On at all times Restrictions Weight Bearing Restrictions: No RLE Weight Bearing: Weight bearing as tolerated  Mobility (including Balance) Bed Mobility Bed Mobility: No Transfers Sit to Stand: 4: Min assist;From chair/3-in-1;With armrests;With upper extremity assist Sit to Stand Details (indicate cue type and reason): cues for hand and right leg placement with standing from chair and BSC. Stand to Sit: 4: Min assist;To chair/3-in-1;With upper extremity assist;With armrests Stand to Sit Details: cues for hand placement with sitting down to chair/BSC and assist to control descent to both. Ambulation/Gait Ambulation/Gait Assistance: 4: Min assist Ambulation/Gait Assistance Details (indicate cue type and reason): cues for posture, sequency and to increase foot clearance on left with swing phase (pt shuffling foot forward). with cues pt progressed to heel strike with both feet with intial  contact. Ambulation Distance (Feet): 18 Feet Assistive device: Rolling walker Gait Pattern: Step-to pattern;Decreased step length - right;Decreased step length - left;Decreased stance time - right;Decreased dorsiflexion - left;Decreased hip/knee flexion - left;Trunk flexed;Shuffle  Posture/Postural Control Posture/Postural Control: No significant limitations   End of Session PT - End of Session Equipment Utilized During Treatment: Gait belt;Right knee immobilizer Activity Tolerance: Patient tolerated treatment well Patient left: in chair;with call bell in reach Nurse Communication: Mobility status for transfers;Mobility status for ambulation General Behavior During Session: [facility] for tasks performed Cognition: [facility] for tasks performed  Sallyanne Kuster 10/30/2011, 12:51 PM  Sallyanne Kuster, PTA Office- [phone] Weekend pager- [phone]

## 2011-10-30 NOTE — Progress Notes (Signed)
[facility] [place] TEAM 1 - Stepdown/ICU TEAM  Subjective: 77 year old female with known history of type 2 diabetes mellitus, hypothyroidism, hypercholesterolemia, obstructive sleep apnea, hypertension, status post right total knee arthroplasty with subsequent infection and extraction 2/25 2011 and subsequent reimplantation of right total knee spacer 12/22/09 presented to [facility] where her knee joint was aspirated. The aspirate showed 5,000+ WBC and gram positive cocci. She was then sent to [facility] for definitive treatment, where she was admitted by the Orthopedic service.    Pt is alert and jovial today. She denies chills, sob, n/v, abdom pain, or cp at present.    Objective: Weight change:   Intake/Output Summary (Last 24 hours) at 10/30/11 1508 Last data filed at 10/30/11 1200  Gross per 24 hour  Intake    640 ml  Output   1900 ml  Net  -1260 ml   Blood pressure 150/63, pulse 110, temperature 99.1 F (37.3 C), temperature source Oral, resp. rate 22, height 5\' 6"  (1.676 m), weight 129.4 kg (285 lb 4.4 oz), SpO2 100.00%.  Physical Exam: General: No acute respiratory distress Lungs: Clear to auscultation bilaterally without wheezes  Cardiovascular: RRR without murmur gallop or rub  Abdomen: morbidly obese, nontender, nondistended, soft, bowel sounds positive, no rebound, no ascites, no appreciable mass Extremities: trace B LE edema - R LE in knee immobilizer   Lab Results:  Basename 10/30/11 0458 10/29/11 0310 10/28/11 0425  NA 140 137 138  K 3.0* 3.6 3.9  CL 99 99 105  CO2 29 25 25   GLUCOSE 179* 159* 135*  BUN 16 17 23   CREATININE 0.93 0.97 1.22*  CALCIUM 10.2 9.5 8.5  MG -- -- --  PHOS -- -- --    Basename 10/30/11 0458 10/29/11 0310 10/28/11 0425  WBC 7.3 5.6 4.7  NEUTROABS -- -- --  HGB 10.0* 10.2* 9.7*  HCT 29.3* 29.7* 29.4*  MCV 89.3 89.7 91.9  PLT 168 133* 122*   Micro Results: Recent Results (from the past 240 hour(s))  CULTURE, BLOOD  (ROUTINE X 2)     Status: Normal   Collection Time   10/26/11 11:00 AM      Component Value Range Status Comment   Specimen Description BLOOD ARM RIGHT   Final    Special Requests BOTTLES DRAWN AEROBIC ONLY 10 CC    Final    Culture  Setup Time [id number]   Final    Culture     Final    Value: STAPHYLOCOCCUS AUREUS     Note: SUSCEPTIBILITIES PERFORMED ON PREVIOUS CULTURE WITHIN THE LAST 5 DAYS.     Note: Gram Stain Report Called to,Read Back By and Verified With: LACY HITT @0401  ON 10/27/2011 BY MCLET   Report Status 10/29/2011 FINAL   Final   CULTURE, BLOOD (ROUTINE X 2)     Status: Normal   Collection Time   10/26/11 11:10 AM      Component Value Range Status Comment   Specimen Description BLOOD HAND RIGHT   Final    Special Requests BOTTLES DRAWN AEROBIC AND ANAEROBIC 10CC EACH   Final    Culture  Setup Time [id number]   Final    Culture     Final    Value: STAPHYLOCOCCUS AUREUS     Note: RIFAMPIN AND GENTAMICIN SHOULD NOT BE USED AS SINGLE DRUGS FOR TREATMENT OF STAPH INFECTIONS.     Note: Gram Stain Report Called to,Read Back By and Verified With: LACY HITT @ 0300 ON 10/27/2011  BY MCLET   Report Status 10/29/2011 FINAL   Final    Organism ID, Bacteria STAPHYLOCOCCUS AUREUS   Final   MRSA PCR SCREENING     Status: Normal   Collection Time   10/26/11 11:17 AM      Component Value Range Status Comment   MRSA by PCR NEGATIVE  NEGATIVE  Final   WOUND CULTURE     Status: Normal   Collection Time   10/26/11  7:29 PM      Component Value Range Status Comment   Specimen Description WOUND KNEE RIGHT   Final    Special Requests NONE   Final    Gram Stain     Final    Value: MODERATE WBC PRESENT, PREDOMINANTLY PMN     NO SQUAMOUS EPITHELIAL CELLS SEEN     RARE GRAM POSITIVE COCCI IN PAIRS   Culture     Final    Value: ABUNDANT STAPHYLOCOCCUS AUREUS     Note: RIFAMPIN AND GENTAMICIN SHOULD NOT BE USED AS SINGLE DRUGS FOR TREATMENT OF STAPH INFECTIONS. This organism is presumed to be  Clindamycin resistant based on detection of inducible Clindamycin resistance.   Report Status 10/29/2011 FINAL   Final    Organism ID, Bacteria STAPHYLOCOCCUS AUREUS   Final   ANAEROBIC CULTURE     Status: Normal (Preliminary result)   Collection Time   10/26/11  7:29 PM      Component Value Range Status Comment   Specimen Description WOUND KNEE RIGHT   Final    Special Requests NONE   Final    Gram Stain     Final    Value: MODERATE WBC PRESENT, PREDOMINANTLY PMN     NO SQUAMOUS EPITHELIAL CELLS SEEN     RARE GRAM POSITIVE COCCI IN PAIRS   Culture     Final    Value: NO ANAEROBES ISOLATED; CULTURE IN PROGRESS FOR 5 DAYS   Report Status PENDING   Incomplete   CULTURE, BLOOD (ROUTINE X 2)     Status: Normal (Preliminary result)   Collection Time   10/28/11  3:30 PM      Component Value Range Status Comment   Specimen Description BLOOD LEFT HAND   Final    Special Requests BOTTLES DRAWN AEROBIC ONLY 5CC   Final    Culture  Setup Time [id number]   Final    Culture     Final    Value:        BLOOD CULTURE RECEIVED NO GROWTH TO DATE CULTURE WILL BE HELD FOR 5 DAYS BEFORE ISSUING A FINAL NEGATIVE REPORT   Report Status PENDING   Incomplete   CULTURE, BLOOD (ROUTINE X 2)     Status: Normal (Preliminary result)   Collection Time   10/28/11  3:35 PM      Component Value Range Status Comment   Specimen Description BLOOD LEFT HAND   Final    Special Requests BOTTLES DRAWN AEROBIC ONLY 3CC   Final    Culture  Setup Time [id number]   Final    Culture     Final    Value:        BLOOD CULTURE RECEIVED NO GROWTH TO DATE CULTURE WILL BE HELD FOR 5 DAYS BEFORE ISSUING A FINAL NEGATIVE REPORT   Report Status PENDING   Incomplete     Studies/Results: All recent x-ray/radiology reports have been reviewed in detail.   Medications: I have reviewed the patient's complete medication list.  Reccomendations:  R knee septic arthritis (recurrent) S/p I&D w/ poly exchange - care as per primary service  and ID  Blood cx+ 2/2 MSSA  As per ID, must consider possibility of SBE - to complete prolonged course of abx for above, so TEE will not add/change our tx plan - TTE is w/o evidence of large vegetations, but was of poor quality   Altered mental status - resolved Appears to be at baseline MS today  hypokalmia Replace and follow  Tachycardia Improved w/ volume expansion   Hypercholesterolemia Resume tx prior to d/c once BP stable   Tobacco abuse Counseling ongoing   Sleep apnea Pt is noncompliant w/ CPAP/does not use - will cont to educate her on need to comply and serious implications   HTN reasonably controlled at present - follow trend w/ hydration  Hypothyroidism? No evidence that she is on hormone replacement as outpt - TSH and FT4 are normal  DM w/ neuropathy CBG not at goal - adjust tx plan further and follow  DVT prophy As ordered by Ortho  Dispo Agree w/ transfer to unit 5000 - [facility] will continue to follow along w/ you  Lonia Blood, MD [facility] Office  [phone] Pager [phone]  On-Call/Text Page:      [email or web address]      password [facility]

## 2011-10-30 NOTE — Progress Notes (Signed)
Pt transferred to room 5004, per MD order. Report called to receiving nurse and all questions answered. Pt belongings also transferred to new room

## 2011-10-30 NOTE — Progress Notes (Signed)
ANTICOAGULATION CONSULT NOTE - Follow Up Consult  Pharmacy Consult for Coumadin Indication: VTE prophylaxis  Allergies  Allergen Reactions  . Gabapentin     REACTION: rash, diarrhea    Patient Measurements: Height: 5\' 6"  (167.6 cm) Weight: 285 lb 4.4 oz (129.4 kg) IBW/kg (Calculated) : 59.3  Heparin Dosing Weight:   Vital Signs: Temp: 99.1 F (37.3 C) (03/10 1200) Temp src: Oral (03/10 1200) BP: 150/63 mmHg (03/10 1200) Pulse Rate: 110  (03/10 1200)  Labs:  Basename 10/30/11 0458 10/29/11 0310 10/28/11 0425  HGB 10.0* 10.2* --  HCT 29.3* 29.7* 29.4*  PLT 168 133* 122*  APTT -- -- --  LABPROT 19.2* 15.6* 15.3*  INR 1.58* 1.21 1.18  HEPARINUNFRC -- -- --  CREATININE 0.93 0.97 1.22*  CKTOTAL -- -- --  CKMB -- -- --  TROPONINI -- -- --   Estimated Creatinine Clearance: 73.1 ml/min (by C-G formula based on Cr of 0.93).  Assessment: 76yof s/p TKA on Coumadin for VTE prophylaxis. INR (1.58) is subtherapeutic but trended up. Pt is also receiving Rifampin which increases the metabolism of warfarin thus decreasing the INR. - H/H stable, Plts improving - No significant bleeding reported  Goal of Therapy:  INR 2-3   Plan:  1. Coumadin 6mg  po x 1 today 2. Follow up AM INR  Cleon Dew [phone] 10/30/2011,3:34 PM

## 2011-10-30 NOTE — Progress Notes (Addendum)
Subjective: 4 Days Post-Op Procedure(s) (LRB): IRRIGATION AND DEBRIDEMENT KNEE WITH POLY EXCHANGE (Right) Patient reports pain as moderate.    Objective: Vital signs in last 24 hours: Temp:  [98.1 F (36.7 C)-98.9 F (37.2 C)] 98.3 F (36.8 C) (03/10 0800) Pulse Rate:  [90-138] 90  (03/10 0800) Resp:  [21-29] 21  (03/10 0800) BP: (142-158)/(66-98) 142/66 mmHg (03/10 0800) SpO2:  [97 %-100 %] 97 % (03/10 0800)  Intake/Output from previous day: 03/09 0701 - 03/10 0700 In: 1090 [P.O.:840; I.V.:200; IV Piggyback:50] Out: 1600 [Urine:1600] Intake/Output this shift: Total I/O In: 480 [P.O.:480] Out: 350 [Urine:350]   Basename 10/30/11 0458 10/29/11 0310 10/28/11 0425  HGB 10.0* 10.2* 9.7*    Basename 10/30/11 0458 10/29/11 0310  WBC 7.3 5.6  RBC 3.28* 3.31*  HCT 29.3* 29.7*  PLT 168 133*    Basename 10/30/11 0458 10/29/11 0310  NA 140 137  K 3.0* 3.6  CL 99 99  CO2 29 25  BUN 16 17  CREATININE 0.93 0.97  GLUCOSE 179* 159*  CALCIUM 10.2 9.5    Basename 10/30/11 0458 10/29/11 0310  LABPT -- --  INR 1.58* 1.21    Neurologically intact  Assessment/Plan: 4 Days Post-Op Procedure(s) (LRB): IRRIGATION AND DEBRIDEMENT KNEE WITH POLY EXCHANGE (Right) Plan:    Transfer to 5000, dressing change.   Eldred Manges 10/30/2011, 11:43 AM  Will call Dr. Ninetta Lights about old culture from 2011 that was  MRSE from knee aspirate.   With no new cultures that show staph epi only MSSA likely [facility] best choice.  I called Dr. Ninetta Lights and we discussed.

## 2011-10-30 NOTE — Progress Notes (Signed)
CSW received consult for SNF. PT recommendation for Inpatient Rehab noted. Will f/u after Inpatient rehab consult and pursue SNF if needed.  Dellie Burns, MSW, [place] [phone] (weekend)

## 2011-10-31 LAB — BASIC METABOLIC PANEL
BUN: 16 mg/dL (ref 6–23)
CO2: 30 mEq/L (ref 19–32)
Chloride: 99 mEq/L (ref 96–112)
Creatinine, Ser: 0.91 mg/dL (ref 0.50–1.10)
GFR calc Af Amer: 70 mL/min — ABNORMAL LOW (ref >90)

## 2011-10-31 LAB — GLUCOSE, CAPILLARY: Glucose-Capillary: 172 mg/dL — ABNORMAL HIGH (ref 70–99)

## 2011-10-31 LAB — PROTIME-INR: Prothrombin Time: 24.5 seconds — ABNORMAL HIGH (ref 11.6–15.2)

## 2011-10-31 LAB — ANAEROBIC CULTURE

## 2011-10-31 MED ORDER — POTASSIUM CHLORIDE CRYS ER 20 MEQ PO TBCR
20.0000 meq | EXTENDED_RELEASE_TABLET | Freq: Every day | ORAL | Status: DC
  Administered 2011-10-31 – 2011-11-02 (×3): 20 meq via ORAL
  Filled 2011-10-31 (×2): qty 1

## 2011-10-31 MED ORDER — WARFARIN SODIUM 4 MG PO TABS
4.0000 mg | ORAL_TABLET | Freq: Once | ORAL | Status: AC
  Administered 2011-10-31: 4 mg via ORAL
  Filled 2011-10-31: qty 1

## 2011-10-31 MED FILL — Perflutren Lipid Microsphere IV Susp 6.52 MG/ML: INTRAVENOUS | Qty: 2 | Status: AC

## 2011-10-31 NOTE — Progress Notes (Addendum)
INFECTIOUS DISEASE PROGRESS NOTE  ID: Catherine [name] is a 77 y.o. female with a hx of right knee replacement in 2004 I believe, who underwent knee revision for infection 2 years ago. She presented to [facility] on 03/06 with right knee pain, fever and chills, and altered mental status. She underwent a knee aspiration At the ER that showed 5000+ WBC and Gram + cocci in clusters .She was transferred to [facility] for further evaluation and treatment. She was found to have cellulitis on her right leg as well.   Principal Problem:  *Infection of total right knee replacement Active Problems:  HYPOTHYROIDISM  DIABETES MELLITUS, TYPE II, WITH NEUROLOGICAL COMPLICATIONS  OBSTRUCTIVE SLEEP APNEA  HYPERTENSION  GERD  Sciatica  Anemia  Tachycardia  Subjective: Now complaints today  Abtx:  Anti-infectives     Start     Dose/Rate Route Frequency Ordered Stop   10/29/11 1400   ceFAZolin (ANCEF) IVPB 1 g/50 mL premix        1 g 100 mL/hr over 30 Minutes Intravenous 3 times per day 10/29/11 1140     10/28/11 1800   rifampin (RIFADIN) capsule 600 mg        600 mg Oral Daily-1800 10/28/11 1556     10/27/11 1500   vancomycin (VANCOCIN) 1,750 mg in sodium chloride 0.9 % 500 mL IVPB  Status:  Discontinued        1,750 mg 250 mL/hr over 120 Minutes Intravenous Every 24 hours 10/26/11 1345 10/29/11 1140   10/26/11 1853   vancomycin (VANCOCIN) powder  Status:  Discontinued          As needed 10/26/11 1916 10/26/11 2015   10/26/11 1850   gentamicin (GARAMYCIN) injection  Status:  Discontinued          As needed 10/26/11 1853 10/26/11 2015   10/26/11 1730   vancomycin (VANCOCIN) powder 1,000 mg  Status:  Discontinued        1,000 mg Other To Surgery 10/26/11 1721 10/26/11 2233   10/26/11 1500   vancomycin (VANCOCIN) 500 mg in sodium chloride 0.9 % 100 mL IVPB        500 mg 100 mL/hr over 60 Minutes Intravenous  Once 10/26/11 1345 10/26/11 1702   10/26/11 1030   vancomycin (VANCOCIN) 1,250 mg in  sodium chloride 0.9 % 250 mL IVPB  Status:  Discontinued        1,250 mg 166.7 mL/hr over 90 Minutes Intravenous Every 12 hours 10/26/11 0929 10/26/11 1345          Medications:  Scheduled:    .  ceFAZolin (ANCEF) IV  1 g Intravenous Q8H  . furosemide  40 mg Oral Daily  . insulin aspart  0-20 Units Subcutaneous TID WC  . insulin aspart  0-5 Units Subcutaneous QHS  . insulin glargine  30 Units Subcutaneous QHS  . metoprolol succinate  100 mg Oral BID  . patient's guide to using coumadin book   Does not apply Once  . potassium chloride  20 mEq Oral Daily  . potassium chloride  40 mEq Oral Q4H  . rifampin  600 mg Oral q1800  . warfarin  4 mg Oral ONCE-1800  . warfarin  6 mg Oral ONCE-1800  . Warfarin - Pharmacist Dosing Inpatient   Does not apply q1800    Objective: Vital signs in last 24 hours: Temp:  [98 F (36.7 C)-98.7 F (37.1 C)] 98 F (36.7 C) (03/11 1300) Pulse Rate:  [67-97] 97  (03/11  1300) Resp:  [20] 20  (03/11 1300) BP: (124-155)/(54-78) 124/78 mmHg (03/11 1300) SpO2:  [94 %-96 %] 94 % (03/11 1300)   General appearance: alert, cooperative and mild distress  Lab Results  Basename 10/31/11 0458 10/30/11 0458 10/29/11 0310  WBC -- 7.3 5.6  HGB -- 10.0* 10.2*  HCT -- 29.3* 29.7*  NA 140 140 --  K 3.7 3.0* --  CL 99 99 --  CO2 30 29 --  BUN 16 16 --  CREATININE 0.91 0.93 --  GLU -- -- --   Liver Panel No results found for this basename: PROT:2,ALBUMIN:2,AST:2,ALT:2,ALKPHOS:2,BILITOT:2,BILIDIR:2,IBILI:2 in the last 72 hours Sedimentation Rate No results found for this basename: ESRSEDRATE in the last 72 hours C-Reactive Protein No results found for this basename: CRP:2 in the last 72 hours  Microbiology: Recent Results (from the past 240 hour(s))  CULTURE, BLOOD (ROUTINE X 2)     Status: Normal   Collection Time   10/26/11 11:00 AM      Component Value Range Status Comment   Specimen Description BLOOD ARM RIGHT   Final    Special Requests  BOTTLES DRAWN AEROBIC ONLY 10 CC    Final    Culture  Setup Time [id number]   Final    Culture     Final    Value: STAPHYLOCOCCUS AUREUS     Note: SUSCEPTIBILITIES PERFORMED ON PREVIOUS CULTURE WITHIN THE LAST 5 DAYS.     Note: Gram Stain Report Called to,Read Back By and Verified With: LACY HITT @0401  ON 10/27/2011 BY MCLET   Report Status 10/29/2011 FINAL   Final   CULTURE, BLOOD (ROUTINE X 2)     Status: Normal   Collection Time   10/26/11 11:10 AM      Component Value Range Status Comment   Specimen Description BLOOD HAND RIGHT   Final    Special Requests BOTTLES DRAWN AEROBIC AND ANAEROBIC 10CC EACH   Final    Culture  Setup Time [id number]   Final    Culture     Final    Value: STAPHYLOCOCCUS AUREUS     Note: RIFAMPIN AND GENTAMICIN SHOULD NOT BE USED AS SINGLE DRUGS FOR TREATMENT OF STAPH INFECTIONS.     Note: Gram Stain Report Called to,Read Back By and Verified With: LACY HITT @ 0300 ON 10/27/2011 BY MCLET   Report Status 10/29/2011 FINAL   Final    Organism ID, Bacteria STAPHYLOCOCCUS AUREUS   Final   MRSA PCR SCREENING     Status: Normal   Collection Time   10/26/11 11:17 AM      Component Value Range Status Comment   MRSA by PCR NEGATIVE  NEGATIVE  Final   WOUND CULTURE     Status: Normal   Collection Time   10/26/11  7:29 PM      Component Value Range Status Comment   Specimen Description WOUND KNEE RIGHT   Final    Special Requests NONE   Final    Gram Stain     Final    Value: MODERATE WBC PRESENT, PREDOMINANTLY PMN     NO SQUAMOUS EPITHELIAL CELLS SEEN     RARE GRAM POSITIVE COCCI IN PAIRS   Culture     Final    Value: ABUNDANT STAPHYLOCOCCUS AUREUS     Note: RIFAMPIN AND GENTAMICIN SHOULD NOT BE USED AS SINGLE DRUGS FOR TREATMENT OF STAPH INFECTIONS. This organism is presumed to be Clindamycin resistant based on detection of inducible Clindamycin resistance.  Report Status 10/29/2011 FINAL   Final    Organism ID, Bacteria STAPHYLOCOCCUS AUREUS   Final     ANAEROBIC CULTURE     Status: Normal   Collection Time   10/26/11  7:29 PM      Component Value Range Status Comment   Specimen Description WOUND KNEE RIGHT   Final    Special Requests NONE   Final    Gram Stain     Final    Value: MODERATE WBC PRESENT, PREDOMINANTLY PMN     NO SQUAMOUS EPITHELIAL CELLS SEEN     RARE GRAM POSITIVE COCCI IN PAIRS   Culture NO ANAEROBES ISOLATED   Final    Report Status 10/31/2011 FINAL   Final   CULTURE, BLOOD (ROUTINE X 2)     Status: Normal (Preliminary result)   Collection Time   10/28/11  3:30 PM      Component Value Range Status Comment   Specimen Description BLOOD LEFT HAND   Final    Special Requests BOTTLES DRAWN AEROBIC ONLY 5CC   Final    Culture  Setup Time [id number]   Final    Culture     Final    Value:        BLOOD CULTURE RECEIVED NO GROWTH TO DATE CULTURE WILL BE HELD FOR 5 DAYS BEFORE ISSUING A FINAL NEGATIVE REPORT   Report Status PENDING   Incomplete   CULTURE, BLOOD (ROUTINE X 2)     Status: Normal (Preliminary result)   Collection Time   10/28/11  3:35 PM      Component Value Range Status Comment   Specimen Description BLOOD LEFT HAND   Final    Special Requests BOTTLES DRAWN AEROBIC ONLY 3CC   Final    Culture  Setup Time [id number]   Final    Culture     Final    Value:        BLOOD CULTURE RECEIVED NO GROWTH TO DATE CULTURE WILL BE HELD FOR 5 DAYS BEFORE ISSUING A FINAL NEGATIVE REPORT   Report Status PENDING   Incomplete     Studies/Results: No results found.   Assessment/Plan: Osteomyelitis R TKR MSSA (R-PEN, Clinda, Erythro) S/P I & D, exchange of poly 10-26-11 Antibiotics day 6.   Her fever has resolved and repeat blood cultures remain negative and will need prolonged course of antibiotics so ok to defer TEE.    She can follow up in the ID clinic in 2-3 weeks to assure she is healing well.  We will arrange.    Thanks and please call with questions.     COMER, ROBERT Infectious Diseases

## 2011-10-31 NOTE — Progress Notes (Signed)
Subjective: Pt stable - pain controlled   Objective: Vital signs in last 24 hours: Temp:  [98.2 F (36.8 C)-99.1 F (37.3 C)] 98.2 F (36.8 C) (03/11 0655) Pulse Rate:  [67-110] 67  (03/11 0655) Resp:  [20-22] 20  (03/11 0655) BP: (124-155)/(54-78) 155/54 mmHg (03/11 0655) SpO2:  [94 %-100 %] 96 % (03/11 0655)  Intake/Output from previous day: 03/10 0701 - 03/11 0700 In: 650 [P.O.:600; IV Piggyback:50] Out: 950 [Urine:950] Intake/Output this shift:    Exam:  Sensation intact distally Intact pulses distally Dorsiflexion/Plantar flexion intact No cellulitis present  Labs:  [facility] 10/30/11 0458 10/29/11 0310  HGB 10.0* 10.2*    Basename 10/30/11 0458 10/29/11 0310  WBC 7.3 5.6  RBC 3.28* 3.31*  HCT 29.3* 29.7*  PLT 168 133*    Basename 10/31/11 0458 10/30/11 0458  NA 140 140  K 3.7 3.0*  CL 99 99  CO2 30 29  BUN 16 16  CREATININE 0.91 0.93  GLUCOSE 165* 179*  CALCIUM 10.2 10.2    Basename 10/31/11 0458 10/30/11 0458  LABPT -- --  INR 2.16* 1.58*    Assessment/Plan: Mobilize with PT - cont IV abx - may need PICC line - possibility of recurrent infxn and need for more surgery d/w pt, the surgery could be 2 stage replant vs fusion   Catherine [name] 10/31/2011, 10:14 AM

## 2011-10-31 NOTE — Progress Notes (Signed)
ANTICOAGULATION CONSULT NOTE - Follow Up Consult  Pharmacy Consult for Coumadin Indication: VTE prophylaxis  Allergies  Allergen Reactions  . Gabapentin     REACTION: rash, diarrhea    Patient Measurements: Height: 5\' 6"  (167.6 cm) Weight: 285 lb 4.4 oz (129.4 kg) IBW/kg (Calculated) : 59.3   Vital Signs: Temp: 98.2 F (36.8 C) (03/11 0655) BP: 155/54 mmHg (03/11 0655) Pulse Rate: 67  (03/11 0655)  Labs:  Basename 10/31/11 0458 10/30/11 0458 10/29/11 0310  HGB -- 10.0* 10.2*  HCT -- 29.3* 29.7*  PLT -- 168 133*  APTT -- -- --  LABPROT 24.5* 19.2* 15.6*  INR 2.16* 1.58* 1.21  HEPARINUNFRC -- -- --  CREATININE 0.91 0.93 0.97  CKTOTAL -- -- --  CKMB -- -- --  TROPONINI -- -- --   Estimated Creatinine Clearance: 74.7 ml/min (by C-G formula based on Cr of 0.91).  Assessment: 76yof s/p TKA on Coumadin for VTE prophylaxis. INR (2.16) at goal on day 4 with INR trending up. Pt is also receiving Rifampin which increases the metabolism of warfarin often increasing warfarin requirements by 50% or more but INR trend is concerning.   Coumadin dosing Date INR Dose given  3/8  1.18  7.5mg   3/9  1.21  7.5mg   3/10  1.58 6mg   3/11 2.16    Goal of Therapy:  INR 2-3   Plan:  -Will give 4mg  coumadin today  Benny Lennert 10/31/2011,8:21 AM

## 2011-10-31 NOTE — Progress Notes (Signed)
Physical Therapy Treatment Note   10/31/11 1015  PT Visit Information  Last PT Received On 10/31/11  Precautions  Precautions Knee  Precaution Comments Per Dr. August Saucer patient now allowed to start active ROM to R knee  Knee Immobilizer On when out of bed or walking  Restrictions  RLE Weight Bearing WBAT  Bed Mobility  Supine to Sit 4: Min assist  Supine to Sit Details (indicate cue type and reason) strong use of hand rail, increased time, labored, minA for R LE management  Sitting - Scoot to Edge of Bed 5: Supervision  Transfers  Sit to Stand 4: Min assist;From bed (contact guard)  Sit to Stand Details (indicate cue type and reason) pt with safe hand placement  Stand to Sit 4: Min assist (contact guard)  Stand to Sit Details verbal cues to reach back for chair  Ambulation/Gait  Ambulation/Gait Assistance 4: Min assist (contact guard)  Ambulation/Gait Assistance Details (indicate cue type and reason) encouargement provided to increase ambulation distance, patient with + DOE requiring 3 standing rest breaks. Patietn wore R KI per MD orders.  Ambulation Distance (Feet) 40 Feet  Assistive device Rolling walker  Gait Pattern Step-to pattern;Decreased step length - right;Decreased stride length;Decreased stance time - right  Stairs No  Posture/Postural Control  Posture/Postural Control No significant limitations  Total Joint Exercises  Ankle Circles/Pumps AROM;Both;10 reps;Supine  Quad Sets AROM;Supine;20 reps;Right  Knee Flexion AROM;Right;20 reps;Supine (pt achieved approx 25 degrees of R knee flexion)  PT - End of Session  Equipment Utilized During Treatment Gait belt;Right knee immobilizer  Activity Tolerance Patient limited by fatigue (onset of SOB)  Patient left in chair;with call bell in reach  Nurse Communication (instructed RN patient safe to ambulate to bathroom with assist of RN staff to use bathroom)  General  Behavior During Session [facility] for tasks performed  Cognition  [facility] for tasks performed  PT - Assessment/Plan  Comments on Treatment Session Patient con't to progress towards all goals. Patient with history of DOE limiting ambulation endurance. Patient to con't to benefit from PT to maximize R LE strength and ROM to maximize functional recovery for safe transition home.  PT Plan Discharge plan remains appropriate;Frequency remains appropriate  PT Frequency Min 5X/week  Follow Up Recommendations Skilled nursing facility  Equipment Recommended Defer to next venue  Acute Rehab PT Goals  PT Goal: Supine/Side to Sit - Progress Progressing toward goal  PT Goal: Sit to Supine/Side - Progress Progressing toward goal  PT Goal: Sit to Stand - Progress Progressing toward goal  PT Goal: Stand to Sit - Progress Progressing toward goal  PT Goal: Ambulate - Progress Progressing toward goal    Pain: 8/10 R knee pain due to mvmt  Lewis Shock, PT, DPT Pager #: [phone] Office #: [phone]

## 2011-10-31 NOTE — Progress Notes (Signed)
[facility] Inpatient Progress Note  10/31/2011  Subjective: Pt says she feels so much better today.  She is sitting up in a chair today.  No complaints.   Objective:  Vital signs in last 24 hours: Filed Vitals:   10/30/11 2232 10/31/11 0043 10/31/11 0655 10/31/11 1300  BP: 124/78 143/57 155/54 124/78  Pulse: 68 94 67 97  Temp: 98.7 F (37.1 C)  98.2 F (36.8 C) 98 F (36.7 C)  TempSrc:      Resp: 20  20 20   Height:      Weight:      SpO2: 94%  96% 94%   Weight change:   Intake/Output Summary (Last 24 hours) at 10/31/11 1441 Last data filed at 10/30/11 1500  Gross per 24 hour  Intake      0 ml  Output    400 ml  Net   -400 ml   Lab Results  Component Value Date   HGBA1C  Value: 6.9 (NOTE)   The ADA recommends the following therapeutic goal for glycemic   control related to Hgb A1C measurement:   Goal of Therapy:   < 7.0% Hgb A1C   Reference: American Diabetes Association: Clinical Practice   Recommendations 2008, Diabetes Care,  2008, 31:(Suppl 1).* 07/16/2008   Lab Results  Component Value Date   CREATININE 0.91 10/31/2011    Review of Systems As above, otherwise all reviewed and reported negative  Physical Exam General: awake, alert, NAD Lungs: Clear to auscultation bilaterally without wheezes  Cardiovascular: normal s1, s2 sounds Abdomen: morbidly obese, nontender, nondistended, soft, bowel sounds positive, no rebound, no ascites, no appreciable mass  Extremities: trace B LE edema - R LE in knee immobilizer  NEURO - awake, alert and oriented  Lab Results: Results for orders placed during the hospital encounter of 10/26/11 (from the past 24 hour(s))  GLUCOSE, CAPILLARY     Status: Abnormal   Collection Time   10/30/11  4:31 PM      Component Value Range   Glucose-Capillary 190 (*) 70 - 99 (mg/dL)   Comment 1 Notify RN    GLUCOSE, CAPILLARY     Status: Abnormal   Collection Time   10/30/11  9:49 PM      Component Value Range   Glucose-Capillary 148  (*) 70 - 99 (mg/dL)  PROTIME-INR     Status: Abnormal   Collection Time   10/31/11  4:58 AM      Component Value Range   Prothrombin Time 24.5 (*) 11.6 - 15.2 (seconds)   INR 2.16 (*) 0.00 - 1.49   BASIC METABOLIC PANEL     Status: Abnormal   Collection Time   10/31/11  4:58 AM      Component Value Range   Sodium 140  135 - 145 (mEq/L)   Potassium 3.7  3.5 - 5.1 (mEq/L)   Chloride 99  96 - 112 (mEq/L)   CO2 30  19 - 32 (mEq/L)   Glucose, Bld 165 (*) 70 - 99 (mg/dL)   BUN 16  6 - 23 (mg/dL)   Creatinine, Ser 7.82  0.50 - 1.10 (mg/dL)   Calcium 95.6  8.4 - 10.5 (mg/dL)   GFR calc non Af Amer 61 (*) >90 (mL/min)   GFR calc Af Amer 70 (*) >90 (mL/min)  MAGNESIUM     Status: Normal   Collection Time   10/31/11  4:58 AM      Component Value Range   Magnesium 1.5  1.5 -  2.5 (mg/dL)  GLUCOSE, CAPILLARY     Status: Abnormal   Collection Time   10/31/11  6:47 AM      Component Value Range   Glucose-Capillary 162 (*) 70 - 99 (mg/dL)  GLUCOSE, CAPILLARY     Status: Abnormal   Collection Time   10/31/11 11:17 AM      Component Value Range   Glucose-Capillary 172 (*) 70 - 99 (mg/dL)   Comment 1 Notify RN      Micro Results: Recent Results (from the past 240 hour(s))  CULTURE, BLOOD (ROUTINE X 2)     Status: Normal   Collection Time   10/26/11 11:00 AM      Component Value Range Status Comment   Specimen Description BLOOD ARM RIGHT   Final    Special Requests BOTTLES DRAWN AEROBIC ONLY 10 CC    Final    Culture  Setup Time [id number]   Final    Culture     Final    Value: STAPHYLOCOCCUS AUREUS     Note: SUSCEPTIBILITIES PERFORMED ON PREVIOUS CULTURE WITHIN THE LAST 5 DAYS.     Note: Gram Stain Report Called to,Read Back By and Verified With: LACY HITT @0401  ON 10/27/2011 BY MCLET   Report Status 10/29/2011 FINAL   Final   CULTURE, BLOOD (ROUTINE X 2)     Status: Normal   Collection Time   10/26/11 11:10 AM      Component Value Range Status Comment   Specimen Description BLOOD HAND  RIGHT   Final    Special Requests BOTTLES DRAWN AEROBIC AND ANAEROBIC 10CC EACH   Final    Culture  Setup Time [id number]   Final    Culture     Final    Value: STAPHYLOCOCCUS AUREUS     Note: RIFAMPIN AND GENTAMICIN SHOULD NOT BE USED AS SINGLE DRUGS FOR TREATMENT OF STAPH INFECTIONS.     Note: Gram Stain Report Called to,Read Back By and Verified With: LACY HITT @ 0300 ON 10/27/2011 BY MCLET   Report Status 10/29/2011 FINAL   Final    Organism ID, Bacteria STAPHYLOCOCCUS AUREUS   Final   MRSA PCR SCREENING     Status: Normal   Collection Time   10/26/11 11:17 AM      Component Value Range Status Comment   MRSA by PCR NEGATIVE  NEGATIVE  Final   WOUND CULTURE     Status: Normal   Collection Time   10/26/11  7:29 PM      Component Value Range Status Comment   Specimen Description WOUND KNEE RIGHT   Final    Special Requests NONE   Final    Gram Stain     Final    Value: MODERATE WBC PRESENT, PREDOMINANTLY PMN     NO SQUAMOUS EPITHELIAL CELLS SEEN     RARE GRAM POSITIVE COCCI IN PAIRS   Culture     Final    Value: ABUNDANT STAPHYLOCOCCUS AUREUS     Note: RIFAMPIN AND GENTAMICIN SHOULD NOT BE USED AS SINGLE DRUGS FOR TREATMENT OF STAPH INFECTIONS. This organism is presumed to be Clindamycin resistant based on detection of inducible Clindamycin resistance.   Report Status 10/29/2011 FINAL   Final    Organism ID, Bacteria STAPHYLOCOCCUS AUREUS   Final   ANAEROBIC CULTURE     Status: Normal   Collection Time   10/26/11  7:29 PM      Component Value Range Status Comment   Specimen Description WOUND  KNEE RIGHT   Final    Special Requests NONE   Final    Gram Stain     Final    Value: MODERATE WBC PRESENT, PREDOMINANTLY PMN     NO SQUAMOUS EPITHELIAL CELLS SEEN     RARE GRAM POSITIVE COCCI IN PAIRS   Culture NO ANAEROBES ISOLATED   Final    Report Status 10/31/2011 FINAL   Final   CULTURE, BLOOD (ROUTINE X 2)     Status: Normal (Preliminary result)   Collection Time   10/28/11  3:30 PM        Component Value Range Status Comment   Specimen Description BLOOD LEFT HAND   Final    Special Requests BOTTLES DRAWN AEROBIC ONLY 5CC   Final    Culture  Setup Time [id number]   Final    Culture     Final    Value:        BLOOD CULTURE RECEIVED NO GROWTH TO DATE CULTURE WILL BE HELD FOR 5 DAYS BEFORE ISSUING A FINAL NEGATIVE REPORT   Report Status PENDING   Incomplete   CULTURE, BLOOD (ROUTINE X 2)     Status: Normal (Preliminary result)   Collection Time   10/28/11  3:35 PM      Component Value Range Status Comment   Specimen Description BLOOD LEFT HAND   Final    Special Requests BOTTLES DRAWN AEROBIC ONLY 3CC   Final    Culture  Setup Time [id number]   Final    Culture     Final    Value:        BLOOD CULTURE RECEIVED NO GROWTH TO DATE CULTURE WILL BE HELD FOR 5 DAYS BEFORE ISSUING A FINAL NEGATIVE REPORT   Report Status PENDING   Incomplete     Medications:  Scheduled Meds:   .  ceFAZolin (ANCEF) IV  1 g Intravenous Q8H  . furosemide  40 mg Oral Daily  . insulin aspart  0-20 Units Subcutaneous TID WC  . insulin aspart  0-5 Units Subcutaneous QHS  . insulin glargine  30 Units Subcutaneous QHS  . metoprolol succinate  100 mg Oral BID  . patient's guide to using coumadin book   Does not apply Once  . potassium chloride  40 mEq Oral Q4H  . rifampin  600 mg Oral q1800  . warfarin  4 mg Oral ONCE-1800  . warfarin  6 mg Oral ONCE-1800  . Warfarin - Pharmacist Dosing Inpatient   Does not apply q1800  . DISCONTD: insulin glargine  20 Units Subcutaneous QHS  . DISCONTD: metoprolol succinate  50 mg Oral BID   Continuous Infusions:   . 0.9 % NaCl with KCl 20 mEq / L 1,000 mL (10/30/11 2116)   PRN Meds:.acetaminophen, acetaminophen, levalbuterol, menthol-cetylpyridinium, methocarbamol, morphine, ondansetron, oxyCODONE, phenol, DISCONTD: methocarbamol(ROBAXIN) IV, DISCONTD: ondansetron (ZOFRAN) IV  Assessment/Plan: R knee septic arthritis (recurrent)  S/p I&D w/ poly  exchange - care as per primary service and ID   Blood cx+ 2/2 MSSA  As per ID, must consider possibility of SBE - to complete prolonged course of abx for above, so TEE will not add/change our tx plan - TTE is w/o evidence of large vegetations, but was of poor quality   Altered mental status - resolved  Appears to be at baseline MS today   hypokalmia  Resolved, will follow  Tachycardia  Improved w/ volume expansion   Hypercholesterolemia  Resume tx prior to d/c once BP stable  Tobacco abuse  Counseling ongoing   Sleep apnea  Pt is noncompliant w/ CPAP/does not use - will cont to educate her on need to comply and serious implications   HTN  reasonably controlled at present - follow trend    Hypothyroidism?  No evidence that she is on hormone replacement as outpt - TSH and FT4 are normal - This will need to be reassessed on an outpatient basis.   DM w/ neuropathy  CBG much better today.  Plan to follow  DVT prophy  As ordered by Ortho    LOS: 5 days   Auda Finfrock 10/31/2011, 2:41 PM   Cleora Fleet, MD, CDE, FAAFP [facility] [place], [place]  [phone]

## 2011-10-31 NOTE — Progress Notes (Signed)
Clinical Social Work-Weekend CSW noted CIR consult; this CSW notes CM and PT recommendations for SNF-CSW initiating FL2 and will f/u with bed search as appropriate-Dorlene Footman-MSW, [phone]

## 2011-10-31 NOTE — Progress Notes (Signed)
CARE MANAGEMENT NOTE 10/31/2011  Patient:  Catherine [name], Catherine [name]   Account Number:  [id number]  Date Initiated:  10/31/2011  Documentation initiated by:  Vance Peper  Subjective/Objective Assessment:   77 yr old female s/p I & D of right knee with poly exchange.     Action/Plan:   Discharge planning. Spoke with patient. She may go to SNF for shortterm rehab or to CIR. Social worker aware. Will follow. Pt may need more surgery per MD notes.  Anticipated DC Date:     Anticipated DC Plan:    In-house referral  Clinical Social Worker      DC Planning Services  CM consult      Choice offered to / List presented to:     DME arranged  NA      DME agency  NA     HH arranged  NA      HH agency  NA   Status of service:  In process, will continue to follow

## 2011-11-01 DIAGNOSIS — T847XXA Infection and inflammatory reaction due to other internal orthopedic prosthetic devices, implants and grafts, initial encounter: Secondary | ICD-10-CM

## 2011-11-01 DIAGNOSIS — Z96659 Presence of unspecified artificial knee joint: Secondary | ICD-10-CM

## 2011-11-01 LAB — COMPREHENSIVE METABOLIC PANEL
BUN: 16 mg/dL (ref 6–23)
Calcium: 10.7 mg/dL — ABNORMAL HIGH (ref 8.4–10.5)
GFR calc Af Amer: 81 mL/min — ABNORMAL LOW (ref >90)
Glucose, Bld: 186 mg/dL — ABNORMAL HIGH (ref 70–99)
Total Protein: 6.5 g/dL (ref 6.0–8.3)

## 2011-11-01 LAB — GLUCOSE, CAPILLARY
Glucose-Capillary: 168 mg/dL — ABNORMAL HIGH (ref 70–99)
Glucose-Capillary: 181 mg/dL — ABNORMAL HIGH (ref 70–99)
Glucose-Capillary: 162 mg/dL — ABNORMAL HIGH (ref 70–99)

## 2011-11-01 LAB — CBC
HCT: 30 % — ABNORMAL LOW (ref 36.0–46.0)
Hemoglobin: 10.2 g/dL — ABNORMAL LOW (ref 12.0–15.0)
WBC: 9 10*3/uL (ref 4.0–10.5)

## 2011-11-01 MED ORDER — SENNA 8.6 MG PO TABS
1.0000 | ORAL_TABLET | Freq: Every day | ORAL | Status: DC | PRN
  Administered 2011-11-01: 8.6 mg via ORAL
  Filled 2011-11-01: qty 1

## 2011-11-01 MED ORDER — PREGABALIN 50 MG PO CAPS
100.0000 mg | ORAL_CAPSULE | Freq: Three times a day (TID) | ORAL | Status: DC
  Administered 2011-11-01 – 2011-11-02 (×3): 100 mg via ORAL
  Filled 2011-11-01 (×3): qty 2

## 2011-11-01 MED ORDER — CEFAZOLIN SODIUM-DEXTROSE 2-3 GM-% IV SOLR
2.0000 g | Freq: Three times a day (TID) | INTRAVENOUS | Status: DC
  Administered 2011-11-01 – 2011-11-02 (×4): 2 g via INTRAVENOUS
  Filled 2011-11-01 (×6): qty 50

## 2011-11-01 MED ORDER — DOCUSATE SODIUM 100 MG PO CAPS
100.0000 mg | ORAL_CAPSULE | Freq: Two times a day (BID) | ORAL | Status: DC
  Administered 2011-11-01 – 2011-11-02 (×3): 100 mg via ORAL
  Filled 2011-11-01 (×4): qty 1

## 2011-11-01 MED ORDER — WARFARIN SODIUM 4 MG PO TABS
4.0000 mg | ORAL_TABLET | Freq: Once | ORAL | Status: AC
  Administered 2011-11-01: 4 mg via ORAL
  Filled 2011-11-01: qty 1

## 2011-11-01 MED ORDER — WARFARIN SODIUM 4 MG PO TABS
4.0000 mg | ORAL_TABLET | Freq: Once | ORAL | Status: DC
  Filled 2011-11-01: qty 1

## 2011-11-01 MED ORDER — SODIUM CHLORIDE 0.9 % IJ SOLN
10.0000 mL | INTRAMUSCULAR | Status: DC | PRN
  Administered 2011-11-02: 10 mL

## 2011-11-01 NOTE — Progress Notes (Signed)
ANTICOAGULATION CONSULT NOTE - Follow Up Consult  Pharmacy Consult for Coumadin Indication: VTE prophylaxis s/p TKA  Allergies  Allergen Reactions  . Gabapentin     REACTION: rash, diarrhea    Patient Measurements: Height: 5\' 6"  (167.6 cm) Weight: 285 lb 4.4 oz (129.4 kg) IBW/kg (Calculated) : 59.3   Vital Signs: Temp: 97.9 F (36.6 C) (03/12 0512) Temp src: Oral (03/11 2106) BP: 135/62 mmHg (03/12 0512) Pulse Rate: 96  (03/12 0512)  Labs:  Basename 11/01/11 0605 10/31/11 0458 10/30/11 0458  HGB 10.2* -- 10.0*  HCT 30.0* -- 29.3*  PLT 289 -- 168  APTT -- -- --  LABPROT 26.4* 24.5* 19.2*  INR 2.38* 2.16* 1.58*  HEPARINUNFRC -- -- --  CREATININE 0.81 0.91 0.93  CKTOTAL -- -- --  CKMB -- -- --  TROPONINI -- -- --   Estimated Creatinine Clearance: 84 ml/min (by C-G formula based on Cr of 0.81).  Assessment: 76yof s/p TKA on Coumadin for VTE prophylaxis. INR therapeutic 2.38. Pt is also receiving Rifampin which increases the metabolism of warfarin often increasing warfarin requirements by 50% or more. No bleeding noted, CBC stable.  Goal of Therapy:  INR 2-3   Plan:  1) Will give 4mg  coumadin today  2) F/u daily INR   Christoper Fabian, PharmD, BCPS Clinical pharmacist, pager [phone] 11/01/2011,8:54 AM

## 2011-11-01 NOTE — Progress Notes (Signed)
Physical Therapy Treatment Note   11/01/11 1115  PT Visit Information  Last PT Received On 11/01/11  Precautions  Precautions Knee  Knee Immobilizer On when out of bed or walking  Restrictions  RLE Weight Bearing WBAT  Bed Mobility  Bed Mobility (pt received sitting up in chair)  Transfers  Sit to Stand 4: Min assist  Sit to Stand Details (indicate cue type and reason) increased time required  Ambulation/Gait  Ambulation/Gait Assistance 4: Min assist  Ambulation/Gait Assistance Details (indicate cue type and reason) contact guard, increased time required, 2 standing resting breaks due to + SOB with activity  Ambulation Distance (Feet) 80 Feet  Assistive device Rolling walker  Gait Pattern Step-to pattern;Decreased step length - right;Decreased stance time - right  Stairs No  Total Joint Exercises  Ankle Circles/Pumps AROM;Both;10 reps;Seated  Quad Sets AROM;Right;10 reps;Seated  Knee Flexion AROM;Right;20 reps;Seated  PT - End of Session  Equipment Utilized During Treatment Gait belt;Right knee immobilizer  Activity Tolerance Patient tolerated treatment well  Patient left in bed;with call bell in reach  General  Behavior During Session [facility] for tasks performed  Cognition [facility] for tasks performed  PT - Assessment/Plan  Comments on Treatment Session Patient with improved ambulation endurance this date. Patient with increased active R knee flexion to approx  45 degrees, in sitting pt can acheive 65 degrees of active R knee flexion. Patient remains unsafe to return home due to patient living alone and patient at increased fall risk. Patient to con't to benefit from skilled PT upon d/c to maximize R LE recovery.  PT Plan Discharge plan remains appropriate;Frequency remains appropriate  PT Frequency Min 5X/week  Follow Up Recommendations Skilled nursing facility  Equipment Recommended Defer to next venue  Acute Rehab PT Goals  PT Goal: Sit to Supine/Side - Progress Progressing toward  goal  PT Goal: Sit to Stand - Progress Progressing toward goal  PT Goal: Stand to Sit - Progress Progressing toward goal  PT Goal: Ambulate - Progress Progressing toward goal  PT Goal: Up/Down Stairs - Progress Progressing toward goal    Pain: 5/10 R knee   Lewis Shock, PT, DPT Pager #: [phone] Office #: [phone]

## 2011-11-01 NOTE — Consult Note (Signed)
Physical Medicine and Rehabilitation Consult Reason for Consult: Right knee infection Referring Phsyician: Dr. Gregory Scott Dean Modelle J [place] is an 77 y.o. female.   HPI: 77 year old right-handed female with history of right total knee replacement 2004 as well as knee revision for infection 2 years ago. She initially presented to [facility] March 6 with right knee pain, fever and chills, altered mental status as well as venous stasis changes to right lower extremity. She underwent knee aspiration in the emergency room showed 5000+ WBC and gram positive cocci in clusters. She was transferred to [facility] for further evaluation. X-ray of right knee showed prosthesis in good position. Underwent irrigation and debridement of the knee with Poly exchange March 6 by Dr. August Saucer. Infectious disease followup Dr. Luciana Axe and presently on intravenous Ancef with questionable duration. Followup cultures remained negative. Patient on Coumadin for deep vein thrombosis prophylaxis. She is weight-bearing as tolerated. Physical therapy treatments ongoing. M.D. has requested physical medicine rehabilitation consult Review of Systems  Constitutional: Positive for fever and chills.  Musculoskeletal: Positive for joint pain.  All other systems reviewed and are negative.   Past Medical History  Diagnosis Date  . Sleep apnea   . Hypertension   . Hypothyroidism   . GERD (gastroesophageal reflux disease)   . Arthritis   . Anxiety   . Diabetes mellitus    Past Surgical History  Procedure Date  . I&d knee with poly exchange 10/26/2011    Procedure: IRRIGATION AND DEBRIDEMENT KNEE WITH POLY EXCHANGE;  Surgeon: Cammy Copa, MD;  Location: [facility] OR;  Service: Orthopedics;  Laterality: Right;  with placement of antibiotic stimulan beads   No family history on file. Social History:  reports that she has quit smoking. Her smoking use included Cigarettes. She does not have any smokeless tobacco history  on file. She reports that she does not use illicit drugs. Her alcohol history not on file. Allergies:  Allergies  Allergen Reactions  . Gabapentin     REACTION: rash, diarrhea   Medications Prior to Admission  Medication Dose Route Frequency Provider Last Rate Last Dose  . 0.9 % NaCl with KCl 20 mEq/ L  infusion   Intravenous Continuous Rolan Lipa, NP 20 mL/hr at 10/30/11 2116 1,000 mL at 10/30/11 2116  . acetaminophen (TYLENOL) suppository 325 mg  325 mg Rectal NOW Caroline More, NP   325 mg at 10/27/11 0228  . acetaminophen (TYLENOL) tablet 650 mg  650 mg Oral Q6H PRN Cammy Copa, MD   650 mg at 10/28/11 2035   Or  . acetaminophen (TYLENOL) suppository 650 mg  650 mg Rectal Q6H PRN Cammy Copa, MD   650 mg at 10/27/11 0020  . acetaminophen (TYLENOL) tablet 500 mg  500 mg Oral Once Rhetta Mura, MD   500 mg at 10/26/11 1043  . ceFAZolin (ANCEF) IVPB 1 g/50 mL premix  1 g Intravenous Q8H Ginnie Smart, MD   1 g at 11/01/11 0506  . furosemide (LASIX) injection 40 mg  40 mg Intravenous STAT Rhetta Mura, MD   40 mg at 10/26/11 1602  . furosemide (LASIX) injection 40 mg  40 mg Intravenous Once Rolan Lipa, NP   40 mg at 10/29/11 0155  . furosemide (LASIX) tablet 40 mg  40 mg Oral Daily Lonia Blood, MD   40 mg at 10/31/11 0931  . insulin aspart (novoLOG) injection 0-20 Units  0-20 Units Subcutaneous TID WC Lonia Blood, MD  4 Units at 10/31/11 1643  . insulin aspart (novoLOG) injection 0-5 Units  0-5 Units Subcutaneous QHS Lonia Blood, MD   0 Units at 10/29/11 2156  . insulin glargine (LANTUS) injection 30 Units  30 Units Subcutaneous QHS Lonia Blood, MD   30 Units at 10/31/11 2257  . levalbuterol (XOPENEX) nebulizer solution 0.63 mg  0.63 mg Nebulization Q4H PRN Rolan Lipa, NP   0.63 mg at 10/29/11 0228  . menthol-cetylpyridinium (CEPACOL) lozenge 3 mg  1 lozenge Oral PRN Cammy Copa, MD       Or   . phenol [facility]) mouth spray 1 spray  1 spray Mouth/Throat PRN Cammy Copa, MD      . methocarbamol (ROBAXIN) tablet 500 mg  500 mg Oral Q6H PRN Cammy Copa, MD   500 mg at 10/31/11 1957  . metoprolol succinate (TOPROL-XL) 24 hr tablet 100 mg  100 mg Oral BID Lonia Blood, MD   100 mg at 10/31/11 2147  . morphine 2 MG/ML injection 1 mg  1 mg Intravenous Q2H PRN Cammy Copa, MD   1 mg at 10/31/11 2152  . ondansetron (ZOFRAN) injection 4 mg  4 mg Intravenous Once PRN Rivka Barbara, MD      . ondansetron [facility]) tablet 4 mg  4 mg Oral Q6H PRN Cammy Copa, MD      . oxyCODONE (Oxy IR/ROXICODONE) immediate release tablet 5-10 mg  5-10 mg Oral Q3H PRN Cammy Copa, MD   10 mg at 10/31/11 1956  . patient's guide to using coumadin book   Does not apply Once Herby Abraham, PHARMD      . potassium chloride SA (K-DUR,KLOR-CON) CR tablet 20 mEq  20 mEq Oral Daily [place] Cyndie Mull, MD   20 mEq at 10/31/11 1644  . potassium chloride SA (K-DUR,KLOR-CON) CR tablet 40 mEq  40 mEq Oral Q4H Lonia Blood, MD   40 mEq at 10/31/11 0043  . rifampin (RIFADIN) capsule 600 mg  600 mg Oral q1800 Gardiner Barefoot, MD   600 mg at 10/31/11 1806  . sodium chloride 0.9 % injection           . sodium chloride 0.9 % injection        10 mL at 10/28/11 2323  . sodium chloride 0.9 % injection        10 mL at 10/29/11 0307  . sodium chloride 0.9 % injection        10 mL at 10/30/11 1120  . sodium chloride 0.9 % injection        10 mL at 10/30/11 1120  . vancomycin (VANCOCIN) 500 mg in sodium chloride 0.9 % 100 mL IVPB  500 mg Intravenous Once Drusilla Kanner, PHARMD   500 mg at 10/26/11 1602  . warfarin (COUMADIN) tablet 4 mg  4 mg Oral ONCE-1800 Benny Lennert, PHARMD   4 mg at 10/31/11 1806  . warfarin (COUMADIN) tablet 6 mg  6 mg Oral ONCE-1800 Dannielle Karvonen [place], PHARMD   6 mg at 10/30/11 1845  . warfarin (COUMADIN) tablet 7.5 mg  7.5 mg Oral ONCE-1800  Cammy Copa, MD      . warfarin (COUMADIN) tablet 7.5 mg  7.5 mg Oral ONCE-1800 Elson Clan, PHARMD   7.5 mg at 10/28/11 1720  . warfarin (COUMADIN) tablet 7.5 mg  7.5 mg Oral ONCE-1800 Dannielle Karvonen [place], PHARMD   7.5 mg at 10/29/11 1806  .  warfarin (COUMADIN) video   Does not apply Once Herby Abraham, PHARMD      . Warfarin - Pharmacist Dosing Inpatient   Does not apply q1800 Herby Abraham, PHARMD      . white petrolatum (VASELINE) gel           . DISCONTD: 0.9 %  sodium chloride infusion   Intravenous Continuous Rhetta Mura, MD 50 mL/hr at 10/26/11 1043 1,000 mL at 10/26/11 1043  . DISCONTD: 0.9 %  sodium chloride infusion    Continuous PRN Rogelia Boga, CRNA      . DISCONTD: 0.9 % irrigation (POUR BTL)    PRN Cammy Copa, MD   1,000 mL at 10/26/11 1917  . DISCONTD: acetaminophen (TYLENOL) suppository 40 mg  40 mg Rectal Q4H PRN Rhetta Mura, MD      . DISCONTD: acetaminophen (TYLENOL) suppository 650 mg  650 mg Rectal Q4H PRN Rhetta Mura, MD   650 mg at 10/26/11 1611  . DISCONTD: acetaminophen (TYLENOL) tablet 650 mg  650 mg Oral Q6H PRN Cammy Copa, MD      . DISCONTD: ePHEDrine injection    PRN Rogelia Boga, CRNA   10 mg at 10/26/11 1926  . DISCONTD: fentaNYL (SUBLIMAZE) injection    PRN Rogelia Boga, CRNA   50 mcg at 10/26/11 1810  . DISCONTD: gentamicin (GARAMYCIN) injection    PRN Cammy Copa, MD   80 mg at 10/26/11 1850  . DISCONTD: HYDROmorphone (DILAUDID) injection 0.25-0.5 mg  0.25-0.5 mg Intravenous Q5 min PRN Rivka Barbara, MD   0.5 mg at 10/26/11 2127  . DISCONTD: insulin aspart (novoLOG) injection 0-9 Units  0-9 Units Subcutaneous Q4H Rhetta Mura, MD   1 Units at 10/27/11 2048  . DISCONTD: insulin aspart (novoLOG) injection 0-9 Units  0-9 Units Subcutaneous TID AC & HS Lonia Blood, MD   2 Units at 10/29/11 0756  . DISCONTD: insulin glargine (LANTUS) injection 10 Units  10 Units  Subcutaneous QHS Rhetta Mura, MD   10 Units at 10/28/11 2135  . DISCONTD: insulin glargine (LANTUS) injection 10 Units  10 Units Subcutaneous QHS Rhetta Mura, MD      . DISCONTD: insulin glargine (LANTUS) injection 20 Units  20 Units Subcutaneous QHS Lonia Blood, MD   20 Units at 10/29/11 2156  . DISCONTD: lactated ringers infusion    Continuous PRN Rogelia Boga, CRNA      . DISCONTD: lactated ringers infusion    Continuous PRN Rogelia Boga, CRNA      . DISCONTD: lidocaine (cardiac) 100 mg/8ml (XYLOCAINE) 20 MG/ML injection 2%    PRN Rogelia Boga, CRNA   40 mg at 10/26/11 1736  . DISCONTD: methocarbamol (ROBAXIN) 500 mg in dextrose 5 % 50 mL IVPB  500 mg Intravenous Q6H PRN Cammy Copa, MD   500 mg at 10/27/11 0422  . DISCONTD: metoCLOPramide (REGLAN) injection 5-10 mg  5-10 mg Intravenous Q8H PRN Cammy Copa, MD      . DISCONTD: metoCLOPramide (REGLAN) tablet 5-10 mg  5-10 mg Oral Q8H PRN Cammy Copa, MD      . DISCONTD: metoprolol succinate (TOPROL-XL) 24 hr tablet 25 mg  25 mg Oral BID Rhetta Mura, MD   25 mg at 10/29/11 1036  . DISCONTD: metoprolol succinate (TOPROL-XL) 24 hr tablet 50 mg  50 mg Oral BID Lonia Blood, MD   50 mg at 10/30/11 0925  . DISCONTD: morphine 2  MG/ML injection 1 mg  1 mg Intravenous Q1H PRN Kathryne Hitch, MD      . DISCONTD: ondansetron [facility]) injection 4 mg  4 mg Intravenous Q6H PRN Cammy Copa, MD      . DISCONTD: ondansetron [facility]) injection    PRN Rogelia Boga, CRNA   4 mg at 10/26/11 1900  . DISCONTD: oxyCODONE (Oxy IR/ROXICODONE) immediate release tablet 5 mg  5 mg Oral Q3H PRN Kathryne Hitch, MD      . DISCONTD: phenylephrine (NEO-SYNEPHRINE) injection    PRN Rogelia Boga, CRNA   80 mcg at 10/26/11 1915  . DISCONTD: propofol (DIPRIVAN) 10 MG/ML infusion    PRN Rogelia Boga, CRNA   170 mg at 10/26/11 1736  . DISCONTD: propranolol (INDERAL) injection    PRN  Rogelia Boga, CRNA   0.25 mg at 10/26/11 1808  . DISCONTD: succinylcholine (ANECTINE) injection    PRN Rogelia Boga, CRNA   120 mg at 10/26/11 1736  . DISCONTD: thrombin spray    PRN Cammy Copa, MD   20,000 Units at 10/26/11 1918  . DISCONTD: vancomycin (VANCOCIN) 1,250 mg in sodium chloride 0.9 % 250 mL IVPB  1,250 mg Intravenous Q12H Crystal Stillinger Robertson, PHARMD   1,250 mg at 10/26/11 1301  . DISCONTD: vancomycin (VANCOCIN) 1,750 mg in sodium chloride 0.9 % 500 mL IVPB  1,750 mg Intravenous Q24H Drusilla Kanner, PHARMD   1,750 mg at 10/28/11 1440  . DISCONTD: vancomycin (VANCOCIN) powder 1,000 mg  1,000 mg Other To OR Cammy Copa, MD      . DISCONTD: vancomycin [facility]) powder    PRN Cammy Copa, MD   500 mg at 10/26/11 1853  . DISCONTD: warfarin (COUMADIN) tablet 5 mg  5 mg Oral Once Herby Abraham, PHARMD       No current outpatient prescriptions on file as of 11/01/2011.    Home: Home Living Lives With: Alone Receives Help From: Family (2 sons live nearby; were not helping PTA) Type of Home: House Home Layout: One level Home Access: Stairs to enter Entrance Stairs-Rails: None (4x4 post) Entrance Stairs-Number of Steps: 1 Bathroom Shower/Tub: Walk-in Contractor: Handicapped height Home Adaptive Equipment: Straight cane;Walker - four wheeled;Bedside commode/3-in-1;Grab bars in shower;Wheelchair - manual Additional Comments: uses cane inside; RW outside; walker does not have seat; 3n1 cannot fit inside shower--wants to get smaller shower seat  Functional History: Prior Function Level of Independence: Independent with basic ADLs;Independent with gait;Requires assistive device for independence Driving: Yes Comments: pt reports she has slept in a recliner for years due to difficulty getting in/out of bed Functional Status:  Mobility: Bed Mobility Bed Mobility: No Supine to Sit: 4: Min assist Supine to Sit Details  (indicate cue type and reason): strong use of hand rail, increased time, labored, minA for R LE management Sitting - Scoot to Edge of Bed: 5: Supervision Sitting - Scoot to Edge of Bed Details (indicate cue type and reason): assist to hold RLE elevated while scooting forward to enable pt to rest Rt foot on floor with KI Transfers Sit to Stand: 4: Min assist;From bed (contact guard) Sit to Stand Details (indicate cue type and reason): pt with safe hand placement Stand to Sit: 4: Min assist (contact guard) Stand to Sit Details: verbal cues to reach back for chair Ambulation/Gait Ambulation/Gait: Yes Ambulation/Gait Assistance: 4: Min assist (contact guard) Ambulation/Gait Assistance Details (indicate cue type and reason): encouargement provided to increase  ambulation distance, patient with + DOE requiring 3 standing rest breaks. Patietn wore R KI per MD orders. Ambulation Distance (Feet): 40 Feet Assistive device: Rolling walker Gait Pattern: Step-to pattern;Decreased step length - right;Decreased stride length;Decreased stance time - right Stairs: No    ADL:    Cognition: Cognition Arousal/Alertness: Awake/alert Orientation Level: Oriented X4 Cognition Arousal/Alertness: Awake/alert Overall Cognitive Status: Appears within functional limits for tasks assessed Orientation Level: Oriented X4 Cognition - Other Comments: time not tested  Blood pressure 135/62, pulse 96, temperature 97.9 F (36.6 C), temperature source Oral, resp. rate 20, height 5\' 6"  (1.676 m), weight 129.4 kg (285 lb 4.4 oz), SpO2 97.00%. Physical Exam  Vitals reviewed. Constitutional: She is oriented to person, place, and time. She appears well-developed.       obese  HENT:  Head: Normocephalic.  Neck: Normal range of motion. Neck supple. No thyromegaly present.  Cardiovascular: Normal rate.   Pulmonary/Chest: Breath sounds normal. She has no wheezes.  Abdominal: She exhibits no distension. There is no  tenderness.  Musculoskeletal:       Limited ROM at right knee due to pain. Wound intact/allevyn in place.   Neurological: She is alert and oriented to person, place, and time. She has normal reflexes. A sensory deficit is present. No cranial nerve deficit.       Pt appropriate. Good insight and awareness. UE grossly 4+.  LLE 3/5 prox and 4 distally.  RLE 1 prox(pain) to 3+ distally.   Skin:       Right knee incision is clean and dry and dressing in place with palpable pulses. Chronic venous stasis changes in legs.  Psychiatric: She has a normal mood and affect. Her behavior is normal. Judgment and thought content normal.    Results for orders placed during the hospital encounter of 10/26/11 (from the past 24 hour(s))  GLUCOSE, CAPILLARY     Status: Abnormal   Collection Time   10/31/11 11:17 AM      Component Value Range   Glucose-Capillary 172 (*) 70 - 99 (mg/dL)   Comment 1 Notify RN    GLUCOSE, CAPILLARY     Status: Abnormal   Collection Time   10/31/11  3:59 PM      Component Value Range   Glucose-Capillary 199 (*) 70 - 99 (mg/dL)   Comment 1 Documented in Chart     Comment 2 Notify RN    GLUCOSE, CAPILLARY     Status: Abnormal   Collection Time   10/31/11  9:59 PM      Component Value Range   Glucose-Capillary 177 (*) 70 - 99 (mg/dL)  PROTIME-INR     Status: Abnormal   Collection Time   11/01/11  6:05 AM      Component Value Range   Prothrombin Time 26.4 (*) 11.6 - 15.2 (seconds)   INR 2.38 (*) 0.00 - 1.49   COMPREHENSIVE METABOLIC PANEL     Status: Abnormal   Collection Time   11/01/11  6:05 AM      Component Value Range   Sodium 138  135 - 145 (mEq/L)   Potassium 3.5  3.5 - 5.1 (mEq/L)   Chloride 98  96 - 112 (mEq/L)   CO2 29  19 - 32 (mEq/L)   Glucose, Bld 186 (*) 70 - 99 (mg/dL)   BUN 16  6 - 23 (mg/dL)   Creatinine, Ser 1.61  0.50 - 1.10 (mg/dL)   Calcium 09.6 (*) 8.4 - 10.5 (mg/dL)   Total  Protein 6.5  6.0 - 8.3 (g/dL)   Albumin 2.5 (*) 3.5 - 5.2 (g/dL)   AST 15   0 - 37 (U/L)   ALT 15  0 - 35 (U/L)   Alkaline Phosphatase 73  39 - 117 (U/L)   Total Bilirubin 0.7  0.3 - 1.2 (mg/dL)   GFR calc non Af Amer 70 (*) >90 (mL/min)   GFR calc Af Amer 81 (*) >90 (mL/min)  CBC     Status: Abnormal   Collection Time   11/01/11  6:05 AM      Component Value Range   WBC 9.0  4.0 - 10.5 (K/uL)   RBC 3.38 (*) 3.87 - 5.11 (MIL/uL)   Hemoglobin 10.2 (*) 12.0 - 15.0 (g/dL)   HCT 16.1 (*) 09.6 - 46.0 (%)   MCV 88.8  78.0 - 100.0 (fL)   MCH 30.2  26.0 - 34.0 (pg)   MCHC 34.0  30.0 - 36.0 (g/dL)   RDW 04.5  40.9 - 81.1 (%)   Platelets 289  150 - 400 (K/uL)  GLUCOSE, CAPILLARY     Status: Abnormal   Collection Time   11/01/11  6:17 AM      Component Value Range   Glucose-Capillary 168 (*) 70 - 99 (mg/dL)   Comment 1 Notify RN     No results found.  Assessment/Plan: Diagnosis: right total knee revision 1. Does the need for close, 24 hr/day medical supervision in concert with the patient's rehab needs make it unreasonable for this patient to be served in a less intensive setting? No 2. Co-Morbidities requiring supervision/potential complications: dm, osa 3. Due to bladder management, bowel management, medication administration and pain management, does the patient require 24 hr/day rehab nursing? No 4. Does the patient require coordinated care of a physician, rehab nurse, PT, OT to address physical and functional deficits in the context of the above medical diagnosis(es)? No Addressing deficits in the following areas: balance, transferring and dressing 5. Can the patient actively participate in an intensive therapy program of at least 3 hrs of therapy per day at least 5 days per week? Yes 6. The potential for patient to make measurable gains while on inpatient rehab is fair 7. Anticipated functional outcomes upon discharge from inpatients are n/a 8. Estimated rehab length of stay to reach the above functional goals is: n/a 9. Does the patient have adequate social  supports to accommodate these discharge functional goals? Potentially 10. Anticipated D/C setting: Home 11. Anticipated post D/C treatments: HH therapy 12. Overall Rehab/Functional Prognosis: good  RECOMMENDATIONS: This patient's condition is appropriate for continued rehabilitative care in the following setting: SNF Patient has agreed to participate in recommended program. Yes and Potentially Note that insurance prior authorization may be required for reimbursement for recommended care.  Comment: Pt is already at a min assist level.  Likely will need a few weeks of antibiotics.  She has concerns over managing abx at home.  In that case, SNF is most appropriate rehab venue.  Ivory Broad, MD 11/01/2011

## 2011-11-01 NOTE — Progress Notes (Addendum)
Subjective: Pt feels better - amb in halls with assist   Objective: Vital signs in last 24 hours: Temp:  [97.9 F (36.6 C)-98.5 F (36.9 C)] 97.9 F (36.6 C) (03/12 0512) Pulse Rate:  [96-106] 96  (03/12 0512) Resp:  [20] 20  (03/12 0512) BP: (124-162)/(59-78) 135/62 mmHg (03/12 0512) SpO2:  [94 %-97 %] 97 % (03/12 0512)  Intake/Output from previous day: 03/11 0701 - 03/12 0700 In: 1180 [I.V.:1180] Out: -  Intake/Output this shift: Total I/O In: 240 [P.O.:240] Out: -   Exam:  Intact pulses distally Dorsiflexion/Plantar flexion intact pt answers ? appropiately - a and  o x 3  Labs:  [facility] 11/01/11 0605 10/30/11 0458  HGB 10.2* 10.0*    Basename 11/01/11 0605 10/30/11 0458  WBC 9.0 7.3  RBC 3.38* 3.28*  HCT 30.0* 29.3*  PLT 289 168    Basename 11/01/11 0605 10/31/11 0458  NA 138 140  K 3.5 3.7  CL 98 99  CO2 29 30  BUN 16 16  CREATININE 0.81 0.91  GLUCOSE 186* 165*  CALCIUM 10.7* 10.2    Basename 11/01/11 0605 10/31/11 0458  LABPT -- --  INR 2.38* 2.16*    Assessment/Plan: PIC line today - ready for SNF tomorrow   Catherine [name] 11/01/2011, 11:44 AM      need to know from ID type and duration of ABX

## 2011-11-01 NOTE — Progress Notes (Signed)
Clinical Social Work-CSW met with pt and son at bedside-pt preference is to d/c home with HHPT. Pt and son relay that if needed pt will d/c to [facility] if PICC placed-CSW initiated bed search and will follow to assist with d/c when appropriate venue identified. Jodean Lima, [phone]

## 2011-11-01 NOTE — Progress Notes (Signed)
[facility] Inpatient Progress Note  11/01/2011  Subjective: Pt without complaints.  Ready to go to SNF tomorrow.   Objective:  Vital signs in last 24 hours: Filed Vitals:   10/31/11 1300 10/31/11 2106 11/01/11 0512 11/01/11 1453  BP: 124/78 162/59 135/62 146/69  Pulse: 97 106 96 108  Temp: 98 F (36.7 C) 98.5 F (36.9 C) 97.9 F (36.6 C) 97.7 F (36.5 C)  TempSrc:  Oral    Resp: 20 20 20 20   Height:      Weight:      SpO2: 94% 96% 97% 94%   Weight change:   Intake/Output Summary (Last 24 hours) at 11/01/11 1557 Last data filed at 11/01/11 1300  Gross per 24 hour  Intake   1600 ml  Output      0 ml  Net   1600 ml   Lab Results  Component Value Date   HGBA1C  Value: 6.9 (NOTE)   The ADA recommends the following therapeutic goal for glycemic   control related to Hgb A1C measurement:   Goal of Therapy:   < 7.0% Hgb A1C   Reference: American Diabetes Association: Clinical Practice   Recommendations 2008, Diabetes Care,  2008, 31:(Suppl 1).* 07/16/2008   Lab Results  Component Value Date   CREATININE 0.81 11/01/2011    Review of Systems As above, otherwise all reviewed and reported negative  Physical Exam General: awake, alert, NAD, lying in bed Lungs: Clear to auscultation bilaterally without wheezes  Cardiovascular: normal s1, s2 sounds  Abdomen: morbidly obese, nontender, nondistended, soft, bowel sounds positive, no rebound, no ascites, no appreciable mass  Extremities: trace B LE edema - R LE in knee immobilizer  NEURO - awake, alert and oriented  Lab Results: Results for orders placed during the hospital encounter of 10/26/11 (from the past 24 hour(s))  GLUCOSE, CAPILLARY     Status: Abnormal   Collection Time   10/31/11  3:59 PM      Component Value Range   Glucose-Capillary 199 (*) 70 - 99 (mg/dL)   Comment 1 Documented in Chart     Comment 2 Notify RN    GLUCOSE, CAPILLARY     Status: Abnormal   Collection Time   10/31/11  9:59 PM   Component Value Range   Glucose-Capillary 177 (*) 70 - 99 (mg/dL)  PROTIME-INR     Status: Abnormal   Collection Time   11/01/11  6:05 AM      Component Value Range   Prothrombin Time 26.4 (*) 11.6 - 15.2 (seconds)   INR 2.38 (*) 0.00 - 1.49   COMPREHENSIVE METABOLIC PANEL     Status: Abnormal   Collection Time   11/01/11  6:05 AM      Component Value Range   Sodium 138  135 - 145 (mEq/L)   Potassium 3.5  3.5 - 5.1 (mEq/L)   Chloride 98  96 - 112 (mEq/L)   CO2 29  19 - 32 (mEq/L)   Glucose, Bld 186 (*) 70 - 99 (mg/dL)   BUN 16  6 - 23 (mg/dL)   Creatinine, Ser 1.61  0.50 - 1.10 (mg/dL)   Calcium 09.6 (*) 8.4 - 10.5 (mg/dL)   Total Protein 6.5  6.0 - 8.3 (g/dL)   Albumin 2.5 (*) 3.5 - 5.2 (g/dL)   AST 15  0 - 37 (U/L)   ALT 15  0 - 35 (U/L)   Alkaline Phosphatase 73  39 - 117 (U/L)   Total Bilirubin 0.7  0.3 - 1.2 (mg/dL)   GFR calc non Af Amer 70 (*) >90 (mL/min)   GFR calc Af Amer 81 (*) >90 (mL/min)  CBC     Status: Abnormal   Collection Time   11/01/11  6:05 AM      Component Value Range   WBC 9.0  4.0 - 10.5 (K/uL)   RBC 3.38 (*) 3.87 - 5.11 (MIL/uL)   Hemoglobin 10.2 (*) 12.0 - 15.0 (g/dL)   HCT 16.1 (*) 09.6 - 46.0 (%)   MCV 88.8  78.0 - 100.0 (fL)   MCH 30.2  26.0 - 34.0 (pg)   MCHC 34.0  30.0 - 36.0 (g/dL)   RDW 04.5  40.9 - 81.1 (%)   Platelets 289  150 - 400 (K/uL)  GLUCOSE, CAPILLARY     Status: Abnormal   Collection Time   11/01/11  6:17 AM      Component Value Range   Glucose-Capillary 168 (*) 70 - 99 (mg/dL)   Comment 1 Notify RN    GLUCOSE, CAPILLARY     Status: Abnormal   Collection Time   11/01/11 10:48 AM      Component Value Range   Glucose-Capillary 181 (*) 70 - 99 (mg/dL)   Comment 1 Documented in Chart     Comment 2 Notify RN      Micro Results: Recent Results (from the past 240 hour(s))  CULTURE, BLOOD (ROUTINE X 2)     Status: Normal   Collection Time   10/26/11 11:00 AM      Component Value Range Status Comment   Specimen Description  BLOOD ARM RIGHT   Final    Special Requests BOTTLES DRAWN AEROBIC ONLY 10 CC    Final    Culture  Setup Time [id number]   Final    Culture     Final    Value: STAPHYLOCOCCUS AUREUS     Note: SUSCEPTIBILITIES PERFORMED ON PREVIOUS CULTURE WITHIN THE LAST 5 DAYS.     Note: Gram Stain Report Called to,Read Back By and Verified With: LACY HITT @0401  ON 10/27/2011 BY MCLET   Report Status 10/29/2011 FINAL   Final   CULTURE, BLOOD (ROUTINE X 2)     Status: Normal   Collection Time   10/26/11 11:10 AM      Component Value Range Status Comment   Specimen Description BLOOD HAND RIGHT   Final    Special Requests BOTTLES DRAWN AEROBIC AND ANAEROBIC 10CC EACH   Final    Culture  Setup Time [id number]   Final    Culture     Final    Value: STAPHYLOCOCCUS AUREUS     Note: RIFAMPIN AND GENTAMICIN SHOULD NOT BE USED AS SINGLE DRUGS FOR TREATMENT OF STAPH INFECTIONS.     Note: Gram Stain Report Called to,Read Back By and Verified With: LACY HITT @ 0300 ON 10/27/2011 BY MCLET   Report Status 10/29/2011 FINAL   Final    Organism ID, Bacteria STAPHYLOCOCCUS AUREUS   Final   MRSA PCR SCREENING     Status: Normal   Collection Time   10/26/11 11:17 AM      Component Value Range Status Comment   MRSA by PCR NEGATIVE  NEGATIVE  Final   WOUND CULTURE     Status: Normal   Collection Time   10/26/11  7:29 PM      Component Value Range Status Comment   Specimen Description WOUND KNEE RIGHT   Final    Special Requests  NONE   Final    Gram Stain     Final    Value: MODERATE WBC PRESENT, PREDOMINANTLY PMN     NO SQUAMOUS EPITHELIAL CELLS SEEN     RARE GRAM POSITIVE COCCI IN PAIRS   Culture     Final    Value: ABUNDANT STAPHYLOCOCCUS AUREUS     Note: RIFAMPIN AND GENTAMICIN SHOULD NOT BE USED AS SINGLE DRUGS FOR TREATMENT OF STAPH INFECTIONS. This organism is presumed to be Clindamycin resistant based on detection of inducible Clindamycin resistance.   Report Status 10/29/2011 FINAL   Final    Organism ID,  Bacteria STAPHYLOCOCCUS AUREUS   Final   ANAEROBIC CULTURE     Status: Normal   Collection Time   10/26/11  7:29 PM      Component Value Range Status Comment   Specimen Description WOUND KNEE RIGHT   Final    Special Requests NONE   Final    Gram Stain     Final    Value: MODERATE WBC PRESENT, PREDOMINANTLY PMN     NO SQUAMOUS EPITHELIAL CELLS SEEN     RARE GRAM POSITIVE COCCI IN PAIRS   Culture NO ANAEROBES ISOLATED   Final    Report Status 10/31/2011 FINAL   Final   CULTURE, BLOOD (ROUTINE X 2)     Status: Normal (Preliminary result)   Collection Time   10/28/11  3:30 PM      Component Value Range Status Comment   Specimen Description BLOOD LEFT HAND   Final    Special Requests BOTTLES DRAWN AEROBIC ONLY 5CC   Final    Culture  Setup Time [id number]   Final    Culture     Final    Value:        BLOOD CULTURE RECEIVED NO GROWTH TO DATE CULTURE WILL BE HELD FOR 5 DAYS BEFORE ISSUING A FINAL NEGATIVE REPORT   Report Status PENDING   Incomplete   CULTURE, BLOOD (ROUTINE X 2)     Status: Normal (Preliminary result)   Collection Time   10/28/11  3:35 PM      Component Value Range Status Comment   Specimen Description BLOOD LEFT HAND   Final    Special Requests BOTTLES DRAWN AEROBIC ONLY 3CC   Final    Culture  Setup Time [id number]   Final    Culture     Final    Value:        BLOOD CULTURE RECEIVED NO GROWTH TO DATE CULTURE WILL BE HELD FOR 5 DAYS BEFORE ISSUING A FINAL NEGATIVE REPORT   Report Status PENDING   Incomplete     Medications:  Scheduled Meds:   .  ceFAZolin (ANCEF) IV  2 g Intravenous Q8H  . docusate sodium  100 mg Oral BID  . furosemide  40 mg Oral Daily  . insulin aspart  0-20 Units Subcutaneous TID WC  . insulin aspart  0-5 Units Subcutaneous QHS  . insulin glargine  30 Units Subcutaneous QHS  . metoprolol succinate  100 mg Oral BID  . patient's guide to using coumadin book   Does not apply Once  . potassium chloride  20 mEq Oral Daily  . pregabalin  100 mg  Oral TID  . rifampin  600 mg Oral q1800  . warfarin  4 mg Oral ONCE-1800  . warfarin  4 mg Oral ONCE-1800  . Warfarin - Pharmacist Dosing Inpatient   Does not apply q1800  . DISCONTD:  ceFAZolin (ANCEF) IV  1 g Intravenous Q8H   Continuous Infusions:   . 0.9 % NaCl with KCl 20 mEq / L 1,000 mL (10/30/11 2116)   PRN Meds:.acetaminophen, acetaminophen, levalbuterol, menthol-cetylpyridinium, methocarbamol, morphine, ondansetron, oxyCODONE, phenol, senna, sodium chloride  Assessment/Plan: R knee septic arthritis (recurrent)  S/p I&D w/ poly exchange - care as per primary service and ID   Blood cx+ 2/2 MSSA  Her fever has resolved and repeat blood cultures remain negative and will need prolonged course of antibiotics   Altered mental status - resolved  Appears to be at baseline MS today   hypokalmia  Resolved, will follow   Hypercholesterolemia  Resume tx prior to d/c once BP stable   Tobacco abuse  Counseling ongoing   Sleep apnea  Pt is noncompliant w/ CPAP/does not use - will cont to educate her on need to comply and serious implications   HTN  reasonably controlled at present - follow trend   Hypothyroidism?  No evidence that she is on hormone replacement as outpt - TSH and FT4 are normal - This will need to be reassessed on an outpatient basis.   DM w/ neuropathy  CBG much better controlled  [facility] will sign off today as pt is going to SNF tomorrow.  Please call us at any time if we can be of any further assistance in the care of this patient.    LOS: 6 days   Catherine [name] 11/01/2011, 3:57 PM   Cleora Fleet, MD, CDE, FAAFP [facility] [place], [place]  [phone]

## 2011-11-01 NOTE — Progress Notes (Signed)
Most appropriate rehab venue is SNF to receive therapy as well as for completion of course of IV antibiotics. Please call me with any questions. Pager [phone]

## 2011-11-01 NOTE — Plan of Care (Signed)
Problem: Phase II Progression Outcomes Goal: Return of bowel function (flatus, BM) IF ABDOMINAL SURGERY:  Outcome: Progressing + flatus; no BM since 3/6; laxative and stool softener ordered today

## 2011-11-02 LAB — GLUCOSE, CAPILLARY
Glucose-Capillary: 158 mg/dL — ABNORMAL HIGH (ref 70–99)
Glucose-Capillary: 173 mg/dL — ABNORMAL HIGH (ref 70–99)

## 2011-11-02 MED ORDER — CEFAZOLIN SODIUM-DEXTROSE 2-3 GM-% IV SOLR: 2.0000 g | Freq: Three times a day (TID) | INTRAVENOUS | Status: DC

## 2011-11-02 MED ORDER — OXYCODONE HCL 5 MG PO TABS: 5.0000 mg | ORAL_TABLET | ORAL | Status: AC | PRN

## 2011-11-02 MED ORDER — METHOCARBAMOL 500 MG PO TABS: 500.0000 mg | ORAL_TABLET | Freq: Three times a day (TID) | ORAL | Status: AC

## 2011-11-02 MED ORDER — RIFAMPIN 300 MG PO CAPS: 600.0000 mg | ORAL_CAPSULE | Freq: Every day | ORAL | Status: AC

## 2011-11-02 MED ORDER — WARFARIN SODIUM 4 MG PO TABS
4.0000 mg | ORAL_TABLET | Freq: Once | ORAL | Status: DC
  Filled 2011-11-02: qty 1

## 2011-11-02 NOTE — Progress Notes (Signed)
Subjective: Pt stable - has PICC line   Objective: Vital signs in last 24 hours: Temp:  [97.7 F (36.5 C)-98.5 F (36.9 C)] 98.5 F (36.9 C) (03/13 0538) Pulse Rate:  [108-112] 112  (03/13 0538) Resp:  [20] 20  (03/13 0538) BP: (142-154)/(69-79) 142/79 mmHg (03/13 0538) SpO2:  [90 %-95 %] 90 % (03/13 0538)  Intake/Output from previous day: 03/12 0701 - 03/13 0700 In: 1330 [P.O.:660; I.V.:520; IV Piggyback:150] Out: -  Intake/Output this shift:    Exam:  Sensation intact distally Intact pulses distally No cellulitis present  Labs:  [facility] 11/01/11 0605  HGB 10.2*    Basename 11/01/11 0605  WBC 9.0  RBC 3.38*  HCT 30.0*  PLT 289    Basename 11/01/11 0605 10/31/11 0458  NA 138 140  K 3.5 3.7  CL 98 99  CO2 29 30  BUN 16 16  CREATININE 0.81 0.91  GLUCOSE 186* 165*  CALCIUM 10.7* 10.2    Basename 11/02/11 0508 11/01/11 0605  LABPT -- --  INR 2.38* 2.38*    Assessment/Plan: Pt stable - vss - fl2 signed - ready for snf - possibility of infxn recurring d/w patient   Catherine [name] 11/02/2011, 7:18 AM

## 2011-11-02 NOTE — Progress Notes (Signed)
ANTICOAGULATION CONSULT NOTE - Follow Up Consult  Pharmacy Consult for Coumadin Indication: VTE prophylaxis s/p TKA  Allergies  Allergen Reactions  . Gabapentin     REACTION: rash, diarrhea    Patient Measurements: Height: 5\' 6"  (167.6 cm) Weight: 285 lb 4.4 oz (129.4 kg) IBW/kg (Calculated) : 59.3   Vital Signs: Temp: 98.5 F (36.9 C) (03/13 0538) BP: 142/79 mmHg (03/13 0538) Pulse Rate: 112  (03/13 0538)  Labs:  Catherine [name] 11/02/11 0508 11/01/11 0605 10/31/11 0458  HGB -- 10.2* --  HCT -- 30.0* --  PLT -- 289 --  APTT -- -- --  LABPROT 26.4* 26.4* 24.5*  INR 2.38* 2.38* 2.16*  HEPARINUNFRC -- -- --  CREATININE -- 0.81 0.91  CKTOTAL -- -- --  CKMB -- -- --  TROPONINI -- -- --   Estimated Creatinine Clearance: 84 ml/min (by C-G formula based on Cr of 0.81).  Assessment: 76yof s/p TKA on Coumadin for VTE prophylaxis. INR therapeutic 2.38. Pt is also receiving Rifampin which increases the metabolism of warfarin often increasing warfarin requirements by 50% or more. No bleeding noted, CBC stable. Noted plans for SNF today.  Goal of Therapy:  INR 2-3   Plan:  -Will give 4mg  coumadin today (could consider home on 4mg /day but will need to watch closely with rifampin as this interaction will increase dose requirements)  Harland German, Pharm D 11/02/2011 8:03 AM

## 2011-11-02 NOTE — Discharge Summary (Signed)
Physician Discharge Summary  Patient ID: Catherine [name] MRN: [id number] DOB/AGE: 75/16/1936 75 y.o.  Admit date: 10/26/2011 Discharge date: 11/02/2011  Admission Diagnoses:  Principal Problem:  *Infection of total right knee replacement Active Problems:  HYPOTHYROIDISM  DIABETES MELLITUS, TYPE II, WITH NEUROLOGICAL COMPLICATIONS  OBSTRUCTIVE SLEEP APNEA  HYPERTENSION  GERD  Sciatica  Anemia  Tachycardia   Discharge Diagnoses:  Same  Surgeries: Procedure(s): IRRIGATION AND DEBRIDEMENT KNEE WITH POLY EXCHANGE on 10/26/2011   Consultants:    Discharged Condition: Stable  Hospital Course: Catherine [name] is an 77 y.o. female who was admitted 10/26/2011 with a chief complaint of  Chief Complaint  Patient presents with  . Knee pain   , and found to have a diagnosis of Infection of total right knee replacement.  They were brought to the operating room on 10/26/2011 and underwent the above named procedures. Organism MSSA.  ID consultation was obtained and they recommended iv and po abx. Patient was initially septic with positive blood cultures and spent 2 days in ICU after ID and poly exchange. On the floor pt was also followed by IM for optimal management of multiple medical comorbidities. Patient was therpeutic on coumadin for DVT prophylaxis at time of dc and also remained afebrile with normal WBC. She will require 6 wks of iv abx followed by long term oral suppression.   Antibiotics given:  Anti-infectives     Start     Dose/Rate Route Frequency Ordered Stop   11/02/11 0000   rifampin (RIFADIN) 300 MG capsule        600 mg Oral Daily-1800 11/02/11 0802 11/12/11 2359   11/02/11 0000   ceFAZolin (ANCEF) 2-3 GM-% SOLR        2 g 100 mL/hr over 30 Minutes Intravenous Every 8 hours 11/02/11 0802 12/21/11 2359   11/01/11 1400   ceFAZolin (ANCEF) IVPB 2 g/50 mL premix        2 g 100 mL/hr over 30 Minutes Intravenous 3 times per day 11/01/11 1032     10/29/11 1400   ceFAZolin  (ANCEF) IVPB 1 g/50 mL premix  Status:  Discontinued        1 g 100 mL/hr over 30 Minutes Intravenous 3 times per day 10/29/11 1140 11/01/11 1032   10/28/11 1800   rifampin (RIFADIN) capsule 600 mg        600 mg Oral Daily-1800 10/28/11 1556     10/27/11 1500   vancomycin (VANCOCIN) 1,750 mg in sodium chloride 0.9 % 500 mL IVPB  Status:  Discontinued        1,750 mg 250 mL/hr over 120 Minutes Intravenous Every 24 hours 10/26/11 1345 10/29/11 1140   10/26/11 1853   vancomycin (VANCOCIN) powder  Status:  Discontinued          As needed 10/26/11 1916 10/26/11 2015   10/26/11 1850   gentamicin (GARAMYCIN) injection  Status:  Discontinued          As needed 10/26/11 1853 10/26/11 2015   10/26/11 1730   vancomycin (VANCOCIN) powder 1,000 mg  Status:  Discontinued        1,000 mg Other To Surgery 10/26/11 1721 10/26/11 2233   10/26/11 1500   vancomycin (VANCOCIN) 500 mg in sodium chloride 0.9 % 100 mL IVPB        500 mg 100 mL/hr over 60 Minutes Intravenous  Once 10/26/11 1345 10/26/11 1702   10/26/11 1030   vancomycin (VANCOCIN) 1,250 mg in sodium chloride 0.9 %  250 mL IVPB  Status:  Discontinued        1,250 mg 166.7 mL/hr over 90 Minutes Intravenous Every 12 hours 10/26/11 0929 10/26/11 1345        .  Recent vital signs:  Filed Vitals:   11/02/11 0538  BP: 142/79  Pulse: 112  Temp: 98.5 F (36.9 C)  Resp: 20    Recent laboratory studies:  Results for orders placed during the hospital encounter of 10/26/11  CBC      Component Value Range   WBC 12.2 (*) 4.0 - 10.5 (K/uL)   RBC 3.79 (*) 3.87 - 5.11 (MIL/uL)   Hemoglobin 11.6 (*) 12.0 - 15.0 (g/dL)   HCT 16.1 (*) 09.6 - 46.0 (%)   MCV 89.4  78.0 - 100.0 (fL)   MCH 30.6  26.0 - 34.0 (pg)   MCHC 34.2  30.0 - 36.0 (g/dL)   RDW 04.5  40.9 - 81.1 (%)   Platelets 131 (*) 150 - 400 (K/uL)  DIFFERENTIAL      Component Value Range   Neutrophils Relative 87 (*) 43 - 77 (%)   Neutro Abs 10.6 (*) 1.7 - 7.7 (K/uL)   Lymphocytes  Relative 5 (*) 12 - 46 (%)   Lymphs Abs 0.7  0.7 - 4.0 (K/uL)   Monocytes Relative 7  3 - 12 (%)   Monocytes Absolute 0.9  0.1 - 1.0 (K/uL)   Eosinophils Relative 0  0 - 5 (%)   Eosinophils Absolute 0.0  0.0 - 0.7 (K/uL)   Basophils Relative 0  0 - 1 (%)   Basophils Absolute 0.0  0.0 - 0.1 (K/uL)  COMPREHENSIVE METABOLIC PANEL      Component Value Range   Sodium 141  135 - 145 (mEq/L)   Potassium 3.6  3.5 - 5.1 (mEq/L)   Chloride 102  96 - 112 (mEq/L)   CO2 27  19 - 32 (mEq/L)   Glucose, Bld 176 (*) 70 - 99 (mg/dL)   BUN 38 (*) 6 - 23 (mg/dL)   Creatinine, Ser 9.14 (*) 0.50 - 1.10 (mg/dL)   Calcium 8.9  8.4 - 78.2 (mg/dL)   Total Protein 6.9  6.0 - 8.3 (g/dL)   Albumin 3.6  3.5 - 5.2 (g/dL)   AST 26  0 - 37 (U/L)   ALT 31  0 - 35 (U/L)   Alkaline Phosphatase 39  39 - 117 (U/L)   Total Bilirubin 0.7  0.3 - 1.2 (mg/dL)   GFR calc non Af Amer 32 (*) >90 (mL/min)   GFR calc Af Amer 37 (*) >90 (mL/min)  CULTURE, BLOOD (ROUTINE X 2)      Component Value Range   Specimen Description BLOOD ARM RIGHT     Special Requests BOTTLES DRAWN AEROBIC ONLY 10 CC      Culture  Setup Time [id number]     Culture       Value: STAPHYLOCOCCUS AUREUS     Note: SUSCEPTIBILITIES PERFORMED ON PREVIOUS CULTURE WITHIN THE LAST 5 DAYS.     Note: Gram Stain Report Called to,Read Back By and Verified With: LACY HITT @0401  ON 10/27/2011 BY [facility]LET   Report Status 10/29/2011 FINAL    CULTURE, BLOOD (ROUTINE X 2)      Component Value Range   Specimen Description BLOOD HAND RIGHT     Special Requests BOTTLES DRAWN AEROBIC AND ANAEROBIC [facility]     Culture  Setup Time [id number]     Culture  Value: STAPHYLOCOCCUS AUREUS     Note: RIFAMPIN AND GENTAMICIN SHOULD NOT BE USED AS SINGLE DRUGS FOR TREATMENT OF STAPH INFECTIONS.     Note: Gram Stain Report Called to,Read Back By and Verified With: LACY HITT @ 0300 ON 10/27/2011 BY MCLET   Report Status 10/29/2011 FINAL     Organism ID, Bacteria  STAPHYLOCOCCUS AUREUS    MRSA PCR SCREENING      Component Value Range   MRSA by PCR NEGATIVE  NEGATIVE   TYPE AND SCREEN      Component Value Range   ABO/RH(D) O POS     Antibody Screen NEG     Sample Expiration 10/29/2011    GLUCOSE, CAPILLARY      Component Value Range   Glucose-Capillary 153 (*) 70 - 99 (mg/dL)   Comment 1 Notify RN    GLUCOSE, CAPILLARY      Component Value Range   Glucose-Capillary 123 (*) 70 - 99 (mg/dL)  WOUND CULTURE      Component Value Range   Specimen Description WOUND KNEE RIGHT     Special Requests NONE     Gram Stain       Value: MODERATE WBC PRESENT, PREDOMINANTLY PMN     NO SQUAMOUS EPITHELIAL CELLS SEEN     RARE GRAM POSITIVE COCCI IN PAIRS   Culture       Value: ABUNDANT STAPHYLOCOCCUS AUREUS     Note: RIFAMPIN AND GENTAMICIN SHOULD NOT BE USED AS SINGLE DRUGS FOR TREATMENT OF STAPH INFECTIONS. This organism is presumed to be Clindamycin resistant based on detection of inducible Clindamycin resistance.   Report Status 10/29/2011 FINAL     Organism ID, Bacteria STAPHYLOCOCCUS AUREUS    ANAEROBIC CULTURE      Component Value Range   Specimen Description WOUND KNEE RIGHT     Special Requests NONE     Gram Stain       Value: MODERATE WBC PRESENT, PREDOMINANTLY PMN     NO SQUAMOUS EPITHELIAL CELLS SEEN     RARE GRAM POSITIVE COCCI IN PAIRS   Culture NO ANAEROBES ISOLATED     Report Status 10/31/2011 FINAL    GLUCOSE, CAPILLARY      Component Value Range   Glucose-Capillary 153 (*) 70 - 99 (mg/dL)  CBC      Component Value Range   WBC 9.1  4.0 - 10.5 (K/uL)   RBC 3.53 (*) 3.87 - 5.11 (MIL/uL)   Hemoglobin 10.9 (*) 12.0 - 15.0 (g/dL)   HCT 78.2 (*) 95.6 - 46.0 (%)   MCV 92.1  78.0 - 100.0 (fL)   MCH 30.9  26.0 - 34.0 (pg)   MCHC 33.5  30.0 - 36.0 (g/dL)   RDW 21.3  08.6 - 57.8 (%)   Platelets 128 (*) 150 - 400 (K/uL)  BASIC METABOLIC PANEL      Component Value Range   Sodium 142  135 - 145 (mEq/L)   Potassium 3.7  3.5 - 5.1 (mEq/L)     Chloride 102  96 - 112 (mEq/L)   CO2 27  19 - 32 (mEq/L)   Glucose, Bld 191 (*) 70 - 99 (mg/dL)   BUN 39 (*) 6 - 23 (mg/dL)   Creatinine, Ser 4.69 (*) 0.50 - 1.10 (mg/dL)   Calcium 9.0  8.4 - 62.9 (mg/dL)   GFR calc non Af Amer 27 (*) >90 (mL/min)   GFR calc Af Amer 31 (*) >90 (mL/min)  SEDIMENTATION RATE      Component  Value Range   Sed Rate 50 (*) 0 - 22 (mm/hr)  GLUCOSE, CAPILLARY      Component Value Range   Glucose-Capillary 178 (*) 70 - 99 (mg/dL)  GLUCOSE, CAPILLARY      Component Value Range   Glucose-Capillary 173 (*) 70 - 99 (mg/dL)   Comment 1 Documented in Chart     Comment 2 Notify RN     Comment 3 Call MD NNP PA CNM    DIFFERENTIAL      Component Value Range   Neutrophils Relative 87 (*) 43 - 77 (%)   Neutro Abs 7.9 (*) 1.7 - 7.7 (K/uL)   Lymphocytes Relative 7 (*) 12 - 46 (%)   Lymphs Abs 0.6 (*) 0.7 - 4.0 (K/uL)   Monocytes Relative 6  3 - 12 (%)   Monocytes Absolute 0.5  0.1 - 1.0 (K/uL)   Eosinophils Relative 0  0 - 5 (%)   Eosinophils Absolute 0.0  0.0 - 0.7 (K/uL)   Basophils Relative 0  0 - 1 (%)   Basophils Absolute 0.0  0.0 - 0.1 (K/uL)  GLUCOSE, CAPILLARY      Component Value Range   Glucose-Capillary 161 (*) 70 - 99 (mg/dL)  PROTIME-INR      Component Value Range   Prothrombin Time 16.7 (*) 11.6 - 15.2 (seconds)   INR 1.33  0.00 - 1.49   GLUCOSE, CAPILLARY      Component Value Range   Glucose-Capillary 146 (*) 70 - 99 (mg/dL)   Comment 1 Documented in Chart     Comment 2 Notify RN    GLUCOSE, CAPILLARY      Component Value Range   Glucose-Capillary 137 (*) 70 - 99 (mg/dL)   Comment 1 Documented in Chart     Comment 2 Notify RN    PROTIME-INR      Component Value Range   Prothrombin Time 15.3 (*) 11.6 - 15.2 (seconds)   INR 1.18  0.00 - 1.49   CBC      Component Value Range   WBC 4.7  4.0 - 10.5 (K/uL)   RBC 3.20 (*) 3.87 - 5.11 (MIL/uL)   Hemoglobin 9.7 (*) 12.0 - 15.0 (g/dL)   HCT 16.1 (*) 09.6 - 46.0 (%)   MCV 91.9  78.0 - 100.0  (fL)   MCH 30.3  26.0 - 34.0 (pg)   MCHC 33.0  30.0 - 36.0 (g/dL)   RDW 04.5  40.9 - 81.1 (%)   Platelets 122 (*) 150 - 400 (K/uL)  TSH      Component Value Range   TSH 2.378  0.350 - 4.500 (uIU/mL)  T3      Component Value Range   T3, Total 36.6 (*) 80.0 - 204.0 (ng/dl)  T4, FREE      Component Value Range   Free T4 0.98  0.80 - 1.80 (ng/dL)  BASIC METABOLIC PANEL      Component Value Range   Sodium 138  135 - 145 (mEq/L)   Potassium 3.9  3.5 - 5.1 (mEq/L)   Chloride 105  96 - 112 (mEq/L)   CO2 25  19 - 32 (mEq/L)   Glucose, Bld 135 (*) 70 - 99 (mg/dL)   BUN 23  6 - 23 (mg/dL)   Creatinine, Ser 9.14 (*) 0.50 - 1.10 (mg/dL)   Calcium 8.5  8.4 - 78.2 (mg/dL)   GFR calc non Af Amer 43 (*) >90 (mL/min)   GFR calc Af Amer 49 (*) >90 (  mL/min)  GLUCOSE, CAPILLARY      Component Value Range   Glucose-Capillary 159 (*) 70 - 99 (mg/dL)   Comment 1 Documented in Chart     Comment 2 Notify RN    GLUCOSE, CAPILLARY      Component Value Range   Glucose-Capillary 124 (*) 70 - 99 (mg/dL)   Comment 1 Documented in Chart     Comment 2 Notify RN    GLUCOSE, CAPILLARY      Component Value Range   Glucose-Capillary 131 (*) 70 - 99 (mg/dL)  GLUCOSE, CAPILLARY      Component Value Range   Glucose-Capillary 116 (*) 70 - 99 (mg/dL)   Comment 1 Documented in Chart     Comment 2 Notify RN    GLUCOSE, CAPILLARY      Component Value Range   Glucose-Capillary 119 (*) 70 - 99 (mg/dL)   Comment 1 Notify RN     Comment 2 Documented in Chart    GLUCOSE, CAPILLARY      Component Value Range   Glucose-Capillary 147 (*) 70 - 99 (mg/dL)   Comment 1 Documented in Chart     Comment 2 Notify RN    CULTURE, BLOOD (ROUTINE X 2)      Component Value Range   Specimen Description BLOOD LEFT HAND     Special Requests BOTTLES DRAWN AEROBIC ONLY 5CC     Culture  Setup Time [id number]     Culture       Value:        BLOOD CULTURE RECEIVED NO GROWTH TO DATE CULTURE WILL BE HELD FOR 5 DAYS BEFORE ISSUING  A FINAL NEGATIVE REPORT   Report Status PENDING    CULTURE, BLOOD (ROUTINE X 2)      Component Value Range   Specimen Description BLOOD LEFT HAND     Special Requests BOTTLES DRAWN AEROBIC ONLY 3CC     Culture  Setup Time [id number]     Culture       Value:        BLOOD CULTURE RECEIVED NO GROWTH TO DATE CULTURE WILL BE HELD FOR 5 DAYS BEFORE ISSUING A FINAL NEGATIVE REPORT   Report Status PENDING    PROTIME-INR      Component Value Range   Prothrombin Time 15.6 (*) 11.6 - 15.2 (seconds)   INR 1.21  0.00 - 1.49   CBC      Component Value Range   WBC 5.6  4.0 - 10.5 (K/uL)   RBC 3.31 (*) 3.87 - 5.11 (MIL/uL)   Hemoglobin 10.2 (*) 12.0 - 15.0 (g/dL)   HCT 21.3 (*) 08.6 - 46.0 (%)   MCV 89.7  78.0 - 100.0 (fL)   MCH 30.8  26.0 - 34.0 (pg)   MCHC 34.3  30.0 - 36.0 (g/dL)   RDW 57.8  46.9 - 62.9 (%)   Platelets 133 (*) 150 - 400 (K/uL)  BASIC METABOLIC PANEL      Component Value Range   Sodium 137  135 - 145 (mEq/L)   Potassium 3.6  3.5 - 5.1 (mEq/L)   Chloride 99  96 - 112 (mEq/L)   CO2 25  19 - 32 (mEq/L)   Glucose, Bld 159 (*) 70 - 99 (mg/dL)   BUN 17  6 - 23 (mg/dL)   Creatinine, Ser 5.28  0.50 - 1.10 (mg/dL)   Calcium 9.5  8.4 - 41.3 (mg/dL)   GFR calc non Af Amer 56 (*) >90 (mL/min)  GFR calc Af Amer 65 (*) >90 (mL/min)  TSH      Component Value Range   TSH 6.494 (*) 0.350 - 4.500 (uIU/mL)  GLUCOSE, CAPILLARY      Component Value Range   Glucose-Capillary 146 (*) 70 - 99 (mg/dL)   Comment 1 Documented in Chart     Comment 2 Notify RN    GLUCOSE, CAPILLARY      Component Value Range   Glucose-Capillary 176 (*) 70 - 99 (mg/dL)   Comment 1 Notify RN     Comment 2 Documented in Chart    PRO B NATRIURETIC PEPTIDE      Component Value Range   Pro B Natriuretic peptide (BNP) 1135.0 (*) 0 - 125 (pg/mL)  GLUCOSE, CAPILLARY      Component Value Range   Glucose-Capillary 163 (*) 70 - 99 (mg/dL)   Comment 1 Documented in Chart     Comment 2 Notify RN    GLUCOSE,  CAPILLARY      Component Value Range   Glucose-Capillary 157 (*) 70 - 99 (mg/dL)  PROTIME-INR      Component Value Range   Prothrombin Time 19.2 (*) 11.6 - 15.2 (seconds)   INR 1.58 (*) 0.00 - 1.49   BASIC METABOLIC PANEL      Component Value Range   Sodium 140  135 - 145 (mEq/L)   Potassium 3.0 (*) 3.5 - 5.1 (mEq/L)   Chloride 99  96 - 112 (mEq/L)   CO2 29  19 - 32 (mEq/L)   Glucose, Bld 179 (*) 70 - 99 (mg/dL)   BUN 16  6 - 23 (mg/dL)   Creatinine, Ser 1.61  0.50 - 1.10 (mg/dL)   Calcium 09.6  8.4 - 10.5 (mg/dL)   GFR calc non Af Amer 59 (*) >90 (mL/min)   GFR calc Af Amer 68 (*) >90 (mL/min)  CBC      Component Value Range   WBC 7.3  4.0 - 10.5 (K/uL)   RBC 3.28 (*) 3.87 - 5.11 (MIL/uL)   Hemoglobin 10.0 (*) 12.0 - 15.0 (g/dL)   HCT 04.5 (*) 40.9 - 46.0 (%)   MCV 89.3  78.0 - 100.0 (fL)   MCH 30.5  26.0 - 34.0 (pg)   MCHC 34.1  30.0 - 36.0 (g/dL)   RDW 81.1  91.4 - 78.2 (%)   Platelets 168  150 - 400 (K/uL)  GLUCOSE, CAPILLARY      Component Value Range   Glucose-Capillary 165 (*) 70 - 99 (mg/dL)   Comment 1 Documented in Chart     Comment 2 Notify RN    GLUCOSE, CAPILLARY      Component Value Range   Glucose-Capillary 184 (*) 70 - 99 (mg/dL)   Comment 1 Documented in Chart     Comment 2 Notify RN    GLUCOSE, CAPILLARY      Component Value Range   Glucose-Capillary 187 (*) 70 - 99 (mg/dL)   Comment 1 Documented in Chart     Comment 2 Notify RN    GLUCOSE, CAPILLARY      Component Value Range   Glucose-Capillary 204 (*) 70 - 99 (mg/dL)   Comment 1 Documented in Chart     Comment 2 Notify RN    GLUCOSE, CAPILLARY      Component Value Range   Glucose-Capillary 190 (*) 70 - 99 (mg/dL)   Comment 1 Notify RN    PROTIME-INR      Component Value Range  Prothrombin Time 24.5 (*) 11.6 - 15.2 (seconds)   INR 2.16 (*) 0.00 - 1.49   BASIC METABOLIC PANEL      Component Value Range   Sodium 140  135 - 145 (mEq/L)   Potassium 3.7  3.5 - 5.1 (mEq/L)   Chloride 99  96  - 112 (mEq/L)   CO2 30  19 - 32 (mEq/L)   Glucose, Bld 165 (*) 70 - 99 (mg/dL)   BUN 16  6 - 23 (mg/dL)   Creatinine, Ser 1.19  0.50 - 1.10 (mg/dL)   Calcium 14.7  8.4 - 10.5 (mg/dL)   GFR calc non Af Amer 61 (*) >90 (mL/min)   GFR calc Af Amer 70 (*) >90 (mL/min)  MAGNESIUM      Component Value Range   Magnesium 1.5  1.5 - 2.5 (mg/dL)  GLUCOSE, CAPILLARY      Component Value Range   Glucose-Capillary 148 (*) 70 - 99 (mg/dL)  GLUCOSE, CAPILLARY      Component Value Range   Glucose-Capillary 162 (*) 70 - 99 (mg/dL)  GLUCOSE, CAPILLARY      Component Value Range   Glucose-Capillary 172 (*) 70 - 99 (mg/dL)   Comment 1 Notify RN    GLUCOSE, CAPILLARY      Component Value Range   Glucose-Capillary 199 (*) 70 - 99 (mg/dL)   Comment 1 Documented in Chart     Comment 2 Notify RN    PROTIME-INR      Component Value Range   Prothrombin Time 26.4 (*) 11.6 - 15.2 (seconds)   INR 2.38 (*) 0.00 - 1.49   COMPREHENSIVE METABOLIC PANEL      Component Value Range   Sodium 138  135 - 145 (mEq/L)   Potassium 3.5  3.5 - 5.1 (mEq/L)   Chloride 98  96 - 112 (mEq/L)   CO2 29  19 - 32 (mEq/L)   Glucose, Bld 186 (*) 70 - 99 (mg/dL)   BUN 16  6 - 23 (mg/dL)   Creatinine, Ser 8.29  0.50 - 1.10 (mg/dL)   Calcium 56.2 (*) 8.4 - 10.5 (mg/dL)   Total Protein 6.5  6.0 - 8.3 (g/dL)   Albumin 2.5 (*) 3.5 - 5.2 (g/dL)   AST 15  0 - 37 (U/L)   ALT 15  0 - 35 (U/L)   Alkaline Phosphatase 73  39 - 117 (U/L)   Total Bilirubin 0.7  0.3 - 1.2 (mg/dL)   GFR calc non Af Amer 70 (*) >90 (mL/min)   GFR calc Af Amer 81 (*) >90 (mL/min)  CBC      Component Value Range   WBC 9.0  4.0 - 10.5 (K/uL)   RBC 3.38 (*) 3.87 - 5.11 (MIL/uL)   Hemoglobin 10.2 (*) 12.0 - 15.0 (g/dL)   HCT 13.0 (*) 86.5 - 46.0 (%)   MCV 88.8  78.0 - 100.0 (fL)   MCH 30.2  26.0 - 34.0 (pg)   MCHC 34.0  30.0 - 36.0 (g/dL)   RDW 78.4  69.6 - 29.5 (%)   Platelets 289  150 - 400 (K/uL)  GLUCOSE, CAPILLARY      Component Value Range    Glucose-Capillary 177 (*) 70 - 99 (mg/dL)  GLUCOSE, CAPILLARY      Component Value Range   Glucose-Capillary 168 (*) 70 - 99 (mg/dL)   Comment 1 Notify RN    GLUCOSE, CAPILLARY      Component Value Range   Glucose-Capillary 181 (*) 70 -  99 (mg/dL)   Comment 1 Documented in Chart     Comment 2 Notify RN    GLUCOSE, CAPILLARY      Component Value Range   Glucose-Capillary 148 (*) 70 - 99 (mg/dL)   Comment 1 Documented in Chart     Comment 2 Notify RN    PROTIME-INR      Component Value Range   Prothrombin Time 26.4 (*) 11.6 - 15.2 (seconds)   INR 2.38 (*) 0.00 - 1.49   GLUCOSE, CAPILLARY      Component Value Range   Glucose-Capillary 162 (*) 70 - 99 (mg/dL)   Comment 1 Notify RN    GLUCOSE, CAPILLARY      Component Value Range   Glucose-Capillary 158 (*) 70 - 99 (mg/dL)   Comment 1 Notify RN      Discharge Medications:   Medication List  As of 11/02/2011  8:02 AM   STOP taking these medications         acetaminophen 325 MG tablet      omega-3 acid ethyl esters 1 G capsule         TAKE these medications         atorvastatin 40 MG tablet   Commonly known as: LIPITOR   Take 40 mg by mouth every evening.      calcium citrate 950 MG tablet   Commonly known as: CALCITRATE - dosed in mg elemental calcium   Take 1 tablet by mouth daily.      ceFAZolin 2-3 GM-% Solr   Commonly known as: ANCEF   Inject 50 mLs (2 g total) into the vein every 8 (eight) hours.      cholecalciferol 400 UNITS Tabs   Commonly known as: VITAMIN D   Take 400 Units by mouth 2 (two) times daily.      fenofibrate 160 MG tablet   Take 160 mg by mouth daily.      furosemide 20 MG tablet   Commonly known as: LASIX   Take 40 mg by mouth daily.      insulin glargine 100 UNIT/ML injection   Commonly known as: LANTUS   Inject 40 Units into the skin at bedtime.      metFORMIN 1000 MG tablet   Commonly known as: GLUCOPHAGE   Take 1,000 mg by mouth 2 (two) times daily with a meal.       methocarbamol 500 MG tablet   Commonly known as: ROBAXIN   Take 1 tablet (500 mg total) by mouth 3 (three) times daily.      metoprolol succinate 50 MG 24 hr tablet   Commonly known as: TOPROL-XL   Take 25 mg by mouth 2 (two) times daily. Take with or immediately following a meal.      oxyCODONE 5 MG immediate release tablet   Commonly known as: Oxy IR/ROXICODONE   Take 1-2 tablets (5-10 mg total) by mouth every 3 (three) hours as needed.      pregabalin 100 MG capsule   Commonly known as: LYRICA   Take 100 mg by mouth 2 (two) times daily.      rifampin 300 MG capsule   Commonly known as: RIFADIN   Take 2 capsules (600 mg total) by mouth daily at 6 PM.            Diagnostic Studies: Dg Chest Port 1 View  10/28/2011  *RADIOLOGY REPORT*  Clinical Data: Dyspnea  PORTABLE CHEST - 1 VIEW  Comparison: 10/26/2011  Findings:  Cardiomegaly.  Central vascular congestion.  Right IJ central venous catheter tip projects over the proximal SVC.  Mild bibasilar linear opacities. Mild interstitial prominence may be exaggerated by technique or represent mild edema pattern.  Cannot exclude small pleural effusions.  No pneumothorax.  No acute osseous abnormality within limitations of technique.  IMPRESSION: Cardiomegaly.  Central vascular congestion. Mild edema pattern not excluded.  Bibasilar opacities, likely atelectasis.  Original Report Authenticated By: Waneta Martins, M.D.   Dg Chest Port 1 View  10/26/2011  *RADIOLOGY REPORT*  Clinical Data: Central line placement.  Chest pain and shortness of breath peri  PORTABLE CHEST - 1 VIEW 9:06 p.m.  Comparison: 10/26/2011 at the 10:36 a.m.  Findings: Jugular vein catheter has been inserted on the right and the tip is in the superior vena cava just above the azygos vein. Cardiomegaly.  Pulmonary vascularity is improved.  Minimal atelectasis at the left base.  No pneumothorax.  IMPRESSION: Central line appears in good position.  Improved vascularity.  Original  Report Authenticated By: Gwynn Burly, M.D.   Chest Portable 1 View  10/26/2011  *RADIOLOGY REPORT*  Clinical Data: Chest pain and shortness of breath.  PORTABLE CHEST - 1 VIEW  Comparison: 10/20/2009  Findings: The cardiopericardial silhouette is enlarged. There is pulmonary vascular congestion without overt pulmonary edema.  There may be some interstitial pulmonary edema at the lung bases.  No substantial pleural effusion.  Telemetry leads overlie the chest.  IMPRESSION: Cardiomegaly with vascular congestion and low lung volumes.  Original Report Authenticated By: ERIC A. MANSELL, M.D.   X-ray Knee Right Port  10/26/2011  *RADIOLOGY REPORT*  Clinical Data: Right knee infection.  Total knee arthroplasty.  PORTABLE RIGHT KNEE - 1-2 VIEW  Comparison: 12/22/2009  Findings: There are multiple antibiotic impregnated beads seen in the right knee joint.  Prosthesis remains in place, unchanged.  IMPRESSION: Antibiotic beads in place.  Original Report Authenticated By: Gwynn Burly, M.D.    Disposition:   Discharge Orders    Future Appointments: Provider: Department: Dept Phone: Center:   11/25/2011 10:00 AM Gardiner Barefoot, MD [facility] [phone] [facility]     Future Orders Please Complete By Expires   Diet - low sodium heart healthy      Call MD / Call 911      Comments:   If you experience chest pain or shortness of breath, CALL 911 and be transported to the hospital emergency room.  If you develope a fever above 101 F, pus (white drainage) or increased drainage or redness at the wound, or calf pain, call your surgeon's office.   Constipation Prevention      Comments:   Drink plenty of fluids.  Prune juice may be helpful.  You may use a stool softener, such as Colace (over the counter) 100 mg twice a day.  Use MiraLax (over the counter) for constipation as needed.   Increase activity slowly as tolerated      Weight Bearing as taught in Physical Therapy      Comments:   Use a walker  or crutches as instructed.   Discharge instructions      Comments:   1. Weight bearing as tolerated right leg 2. OK to bend knee 3. Continue with IV abx 4. Keep incision dry      Follow-up Information    Follow up with Everlean Cherry, MD .          Signed: Rise Paganini SCOTT 11/02/2011, 8:02 AM

## 2011-11-02 NOTE — Progress Notes (Signed)
Physical Therapy Treatment Note   11/02/11 1132  PT Visit Information  Last PT Received On 11/02/11  Precautions  Precautions Knee  Knee Immobilizer On when out of bed or walking  Restrictions  RLE Weight Bearing WBAT  Bed Mobility  Supine to Sit 5: Supervision;With rails;HOB flat  Supine to Sit Details (indicate cue type and reason) strong use of bed rails, pt able to manage bilat LEs I'ly  Transfers  Sit to Stand 5: Supervision  Sit to Stand Details (indicate cue type and reason) verbal cues for hand placement  Stand to Sit 4: Min assist  Stand to Sit Details verbal cues for hand placement and to control descent  Ambulation/Gait  Ambulation/Gait Assistance 4: Min assist (contact guard)  Ambulation/Gait Assistance Details (indicate cue type and reason) pt with increased step length but decreased R LE WBing  Ambulation Distance (Feet) 100 Feet  Assistive device Rolling walker  Gait Pattern Step-to pattern;Decreased step length - right;Decreased stance time - right  Stairs No  Posture/Postural Control  Posture/Postural Control No significant limitations  Exercises  Exercises (pt returned demonstrated all ther ex in HEP)  PT - End of Session  Equipment Utilized During Treatment Gait belt;Right knee immobilizer  Activity Tolerance Patient tolerated treatment well  Patient left in chair;with call bell in reach;with family/visitor present  General  Behavior During Session [facility] for tasks performed  Cognition [facility] for tasks performed  PT - Assessment/Plan  Comments on Treatment Session Patient with increased ambulation tolerance and with report of leaving this date for SNF.   PT Plan Discharge plan remains appropriate  Follow Up Recommendations Skilled nursing facility  Acute Rehab PT Goals  PT Goal: Supine/Side to Sit - Progress Progressing toward goal  PT Goal: Sit to Supine/Side - Progress Progressing toward goal  PT Goal: Sit to Stand - Progress Progressing toward goal  PT  Goal: Stand to Sit - Progress Progressing toward goal  PT Goal: Ambulate - Progress Progressing toward goal    Pain: Pt denies pain at this time  Lewis Shock, PT, DPT Pager #: [phone] Office #: [phone]

## 2011-11-02 NOTE — Progress Notes (Signed)
Clinical Social Work-CSW sent d/c summary to SNF who confirmed they are able to accept pt-CSW in process of facilitating chart preparing d/c paperwork and will facilitate d/c to [facility] with PTAR transportation. Jodean Lima, [phone]

## 2011-11-02 NOTE — Progress Notes (Signed)
Clinical Social Work-CSW arranged [facility] transport to [facility] with d/c packet-No further needs-Wardell Pokorski-MSW, [phone]

## 2011-11-03 LAB — CULTURE, BLOOD (ROUTINE X 2)
Culture  Setup Time: 201303082221
Culture: NO GROWTH

## 2011-11-25 ENCOUNTER — Ambulatory Visit (INDEPENDENT_AMBULATORY_CARE_PROVIDER_SITE_OTHER): Payer: Medicare Other | Admitting: Internal Medicine

## 2011-11-25 ENCOUNTER — Encounter: Payer: Self-pay | Admitting: Internal Medicine

## 2011-11-25 VITALS — BP 140/81 | HR 110 | Temp 98.0°F | Ht 66.0 in | Wt 275.0 lb

## 2011-11-25 DIAGNOSIS — Z96659 Presence of unspecified artificial knee joint: Secondary | ICD-10-CM

## 2011-11-25 DIAGNOSIS — T8450XA Infection and inflammatory reaction due to unspecified internal joint prosthesis, initial encounter: Secondary | ICD-10-CM

## 2011-11-25 DIAGNOSIS — T8453XA Infection and inflammatory reaction due to internal right knee prosthesis, initial encounter: Secondary | ICD-10-CM

## 2011-11-25 NOTE — Assessment & Plan Note (Signed)
Doing well, tolerating antibiotics.  Labs reassuring.  She will continue with the IV ancef and rifampin to complete a 6 week course and follow up at that time, in 2 weeks and if ok, will consider changing her to po therapy for an extended period.

## 2011-11-25 NOTE — Progress Notes (Signed)
  Subjective:    Patient ID: Catherine [name], female    DOB: May 03, 1935, 77 y.o.   MRN: [id number]  HPI  Ms. [name] is a 77 yo patient with a hx of right knee replacement in 2004, who underwent knee revision for infection 2 years ago. She presented to [facility] on 03/06 with right knee pain, fever and chills, and altered mental status. She underwent a knee aspiration At the ER that showed 5000+ WBC and Gram + cocci in clusters .She was transferred to [facility] for further evaluation and treatment. She was found to have cellulitis on her right leg as well. An irrigation and drainage was performed with poly exchange today since it was acute. Subsequent blood cultures grew out MSSA and she was discharged on Ancef + Rifampin.  She comes in today 4 weeks into her IV antibiotic course.  She has had no diarrhea, no rashes, no PICC line problems.  No knee swelling and no erythema.  Pain has greatly improved.  Labs have been reassuring.      Review of Systems  Constitutional: Negative for fever, chills, appetite change and unexpected weight change.  Gastrointestinal: Negative for nausea, abdominal pain, constipation and abdominal distention.  Musculoskeletal: Negative for myalgias, joint swelling and arthralgias.  Skin: Negative for pallor and rash.       Objective:   Physical Exam  Constitutional: She appears well-developed and well-nourished. No distress.  Cardiovascular: Normal rate, regular rhythm and normal heart sounds.  Exam reveals no gallop and no friction rub.   No murmur heard. Musculoskeletal: She exhibits no edema and no tenderness.  Skin: Skin is warm and dry. No rash noted. No erythema.          Assessment & Plan:

## 2011-12-08 ENCOUNTER — Encounter: Payer: Self-pay | Admitting: Internal Medicine

## 2011-12-08 ENCOUNTER — Ambulatory Visit (INDEPENDENT_AMBULATORY_CARE_PROVIDER_SITE_OTHER): Payer: Medicare Other | Admitting: Internal Medicine

## 2011-12-08 VITALS — BP 125/75 | HR 103 | Temp 98.2°F | Ht 66.0 in | Wt 269.0 lb

## 2011-12-08 DIAGNOSIS — T8450XA Infection and inflammatory reaction due to unspecified internal joint prosthesis, initial encounter: Secondary | ICD-10-CM

## 2011-12-08 DIAGNOSIS — Z96659 Presence of unspecified artificial knee joint: Secondary | ICD-10-CM

## 2011-12-08 DIAGNOSIS — T8453XA Infection and inflammatory reaction due to internal right knee prosthesis, initial encounter: Secondary | ICD-10-CM

## 2011-12-08 MED ORDER — CEFUROXIME AXETIL 500 MG PO TABS: 500.0000 mg | ORAL_TABLET | Freq: Two times a day (BID) | ORAL | Status: DC

## 2011-12-09 ENCOUNTER — Encounter: Payer: Self-pay | Admitting: Internal Medicine

## 2011-12-09 NOTE — Progress Notes (Signed)
  Subjective:    Patient ID: Catherine [name], female    DOB: 09/29/1934, 77 y.o.   MRN: [id number]  HPI Catherine [name] is a 77 yo patient with a hx of right knee replacement in 2004, who underwent knee revision for infection 2 years ago. She presented to [facility] on 03/06 with right knee pain, fever and chills, and altered mental status. She underwent a knee aspiration At the ER that showed 5000+ WBC and Gram + cocci in clusters .She was transferred to [facility] for further evaluation and treatment. She was found to have cellulitis on her right leg as well. An irrigation and drainage was performed with poly exchange today since it was acute. Subsequent blood cultures grew out MSSA and she was discharged on Ancef + Rifampin. She comes in today 6 weeks into her IV antibiotic course. She has had no diarrhea, no rashes, no PICC line problems. No knee swelling and no erythema. Pain has greatly improved. Labs have continued to be reassuring.     Review of Systems  Constitutional: Negative for fever, fatigue and unexpected weight change.  Cardiovascular: Negative for chest pain, palpitations and leg swelling.  Gastrointestinal: Negative for abdominal pain and diarrhea.  Musculoskeletal: Negative for joint swelling.  Skin: Negative for rash.  Neurological: Negative for dizziness, weakness and headaches.       Objective:   Physical Exam  Constitutional: She appears well-developed and well-nourished. No distress.  Cardiovascular: Normal rate and regular rhythm.  Exam reveals no gallop and no friction rub.   No murmur heard. Pulmonary/Chest: Effort normal and breath sounds normal. No respiratory distress. She has no wheezes. She has no rales.  Musculoskeletal:       Incision well healed, no effusion  Skin: No rash noted.          Assessment & Plan:

## 2011-12-09 NOTE — Assessment & Plan Note (Signed)
She is doing well and I have had the PICC line removed.  I have started her on oral suppressive therapy with an oral cephalosporin with good Staph aureus activity.  I discussed with her the likely need for long term suppressive therapy though she does express her reluctance to continuing with antibiotics for a prolonged period.  I discussed the consequences of reinfection which possibly could include resection or amputation.  I will have her return in 3 months to further discuss.

## 2011-12-12 ENCOUNTER — Ambulatory Visit: Payer: Medicare Other | Admitting: Internal Medicine

## 2012-03-13 ENCOUNTER — Ambulatory Visit (INDEPENDENT_AMBULATORY_CARE_PROVIDER_SITE_OTHER): Payer: Medicare Other | Admitting: Internal Medicine

## 2012-03-13 ENCOUNTER — Encounter: Payer: Self-pay | Admitting: Internal Medicine

## 2012-03-13 VITALS — BP 116/71 | HR 94 | Temp 98.1°F | Ht 66.0 in | Wt 270.8 lb

## 2012-03-13 DIAGNOSIS — T8450XA Infection and inflammatory reaction due to unspecified internal joint prosthesis, initial encounter: Secondary | ICD-10-CM

## 2012-03-13 DIAGNOSIS — T8453XA Infection and inflammatory reaction due to internal right knee prosthesis, initial encounter: Secondary | ICD-10-CM

## 2012-03-13 DIAGNOSIS — Z96659 Presence of unspecified artificial knee joint: Secondary | ICD-10-CM

## 2012-03-13 LAB — CBC WITH DIFFERENTIAL/PLATELET
Basophils Absolute: 0 10*3/uL (ref 0.0–0.1)
Basophils Relative: 0 % (ref 0–1)
Eosinophils Absolute: 0.1 10*3/uL (ref 0.0–0.7)
Eosinophils Relative: 3 % (ref 0–5)
Lymphs Abs: 0.8 10*3/uL (ref 0.7–4.0)
MCH: 27.5 pg (ref 26.0–34.0)
MCHC: 34.4 g/dL (ref 30.0–36.0)
MCV: 79.8 fL (ref 78.0–100.0)
Platelets: 176 10*3/uL (ref 150–400)
RDW: 15.7 % — ABNORMAL HIGH (ref 11.5–15.5)

## 2012-03-13 LAB — COMPLETE METABOLIC PANEL WITH GFR
ALT: 19 U/L (ref 0–35)
AST: 16 U/L (ref 0–37)
BUN: 60 mg/dL — ABNORMAL HIGH (ref 6–23)
CO2: 31 mEq/L (ref 19–32)
Creat: 1.56 mg/dL — ABNORMAL HIGH (ref 0.50–1.10)
GFR, Est African American: 37 mL/min — ABNORMAL LOW
Total Bilirubin: 0.5 mg/dL (ref 0.3–1.2)

## 2012-03-13 MED ORDER — CEFUROXIME AXETIL 500 MG PO TABS: 500.0000 mg | ORAL_TABLET | Freq: Two times a day (BID) | ORAL | Status: DC

## 2012-03-13 NOTE — Progress Notes (Signed)
  Subjective:    Patient ID: Catherine [name], female    DOB: [birth year], 77 y.o.   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 She does have an extensive history of infection including infection 2 years ago requiring knee revision. She then in March of this year developed knee pain and fever and chills which was significant for MSSA infection. She did undergo poly-exchange for the acute infection and completed a six-week course of Ancef and rifampin. She then was started on cefuroxime for suppressive therapy and is now 3 months into that. She is tolerating her antibiotic well with no complaints of diarrhea, fever or chills. She is pleased with her continued good health in regards to her left knee and is pleased with taking the antibiotic. She expresses her gratitude to all that has been done to keep her well.   Review of Systems  Constitutional: Negative for fever, chills, fatigue and unexpected weight change.  Cardiovascular: Negative for chest pain and leg swelling.  Gastrointestinal: Negative for nausea, vomiting, abdominal pain, diarrhea and constipation.  Musculoskeletal: Negative for myalgias, joint swelling and arthralgias.  Skin: Negative for rash.  Neurological: Negative for dizziness and headaches.  Hematological: Negative for adenopathy.  Psychiatric/Behavioral: Negative for dysphoric mood. The patient is not nervous/anxious.        Objective:   Physical Exam  Constitutional: She appears well-developed and well-nourished. No distress.  Cardiovascular: Normal rate, regular rhythm and normal heart sounds.  Exam reveals no gallop and no friction rub.   No murmur heard. Pulmonary/Chest: Effort normal and breath sounds normal. No respiratory distress. She has no wheezes. She has no rales.  Abdominal: Soft. Bowel sounds are normal. She exhibits no distension. There is no tenderness.  Musculoskeletal:       No knee effusion  Skin: No rash  noted.          Assessment & Plan:

## 2012-03-13 NOTE — Assessment & Plan Note (Signed)
She is tolerating the suppressive therapy well. I will check her labs today to assure no significant side effects. At this time, I did discuss with her the continuation of the medication indefinitely since she is tolerating that well and another infection can potentially be devastating to her knee and leg requiring extensive debridement and possibly prosthetic removal or amputation. Therefore will have her continued on this suppressive therapy. She will return in 6 months and knows to call if she has any problems prior to that time.

## 2012-03-14 ENCOUNTER — Telehealth: Payer: Self-pay | Admitting: Licensed Clinical Social Worker

## 2012-03-14 NOTE — Telephone Encounter (Signed)
I called the patient to inform her that her kidney function was elevated and that she may need to increase fluids due to possible dehydration. She will have her bmp rechecked at her pcp office in [place]. I spoke with Lynden Ang at that office and she said it would be fine. I called the patient back to let her know to go next Wednesday or Thursday and she informed me that her pcp had increased her lasix. I told her to call to let her pcp know that it may need to be decreased to her elevated kidney function.

## 2012-03-14 NOTE — Telephone Encounter (Signed)
Message copied by Harvie Bridge on Wed Mar 14, 2012  4:21 PM ------      Message from: Gardiner Barefoot      Created: Wed Mar 14, 2012  3:07 PM       Her creat is elevated.  It looks more like dehydration with elevated BUN.  It should be rechecked in 1 week.  I do not think it is the antibiotic I am giving her which is not renally cleared.  She can come here to have it rechecked or go to her primary physician, just let him know why.  Thanks

## 2012-03-26 ENCOUNTER — Telehealth: Payer: Self-pay | Admitting: *Deleted

## 2012-03-26 NOTE — Telephone Encounter (Signed)
Call from [facility], they have closed the patient, as she is not home bound.  Any future labs will need to be drawn by her PCP. Wendall Mola CMA

## 2012-04-04 ENCOUNTER — Other Ambulatory Visit: Payer: Self-pay | Admitting: Internal Medicine

## 2012-11-01 ENCOUNTER — Other Ambulatory Visit: Payer: Self-pay | Admitting: Internal Medicine

## 2012-11-01 ENCOUNTER — Other Ambulatory Visit: Payer: Self-pay | Admitting: *Deleted

## 2012-11-01 DIAGNOSIS — T8459XD Infection and inflammatory reaction due to other internal joint prosthesis, subsequent encounter: Secondary | ICD-10-CM

## 2012-11-01 MED ORDER — CEFUROXIME AXETIL 500 MG PO TABS: 500.0000 mg | ORAL_TABLET | Freq: Two times a day (BID) | ORAL | Status: DC

## 2012-12-04 ENCOUNTER — Other Ambulatory Visit: Payer: Self-pay | Admitting: Internal Medicine

## 2012-12-04 DIAGNOSIS — M869 Osteomyelitis, unspecified: Secondary | ICD-10-CM

## 2014-03-21 ENCOUNTER — Telehealth: Payer: Self-pay | Admitting: Pulmonary Disease

## 2014-03-21 NOTE — Telephone Encounter (Signed)
Per Crystal:  Sent Adriane w/ [employer] down to medical records to obtain a copy of pt's sleep study.  Adriane verbalized understanding.  Nothing further needed at this time.  Antionette FairyHolly D Pryor

## 2015-01-29 ENCOUNTER — Other Ambulatory Visit: Payer: Self-pay

## 2016-02-15 DIAGNOSIS — I509 Heart failure, unspecified: Secondary | ICD-10-CM

## 2016-02-15 DIAGNOSIS — E039 Hypothyroidism, unspecified: Secondary | ICD-10-CM

## 2016-02-15 DIAGNOSIS — I10 Essential (primary) hypertension: Secondary | ICD-10-CM

## 2016-02-15 DIAGNOSIS — D649 Anemia, unspecified: Secondary | ICD-10-CM

## 2016-02-15 DIAGNOSIS — E1142 Type 2 diabetes mellitus with diabetic polyneuropathy: Secondary | ICD-10-CM

## 2016-02-15 DIAGNOSIS — E785 Hyperlipidemia, unspecified: Secondary | ICD-10-CM

## 2016-02-15 DIAGNOSIS — N189 Chronic kidney disease, unspecified: Secondary | ICD-10-CM

## 2016-02-15 DIAGNOSIS — J9601 Acute respiratory failure with hypoxia: Secondary | ICD-10-CM

## 2016-02-15 DIAGNOSIS — R7989 Other specified abnormal findings of blood chemistry: Secondary | ICD-10-CM

## 2016-02-15 DIAGNOSIS — J449 Chronic obstructive pulmonary disease, unspecified: Secondary | ICD-10-CM

## 2016-02-16 DIAGNOSIS — D649 Anemia, unspecified: Secondary | ICD-10-CM | POA: Diagnosis not present

## 2016-02-16 DIAGNOSIS — I509 Heart failure, unspecified: Secondary | ICD-10-CM | POA: Diagnosis not present

## 2016-02-16 DIAGNOSIS — R601 Generalized edema: Secondary | ICD-10-CM

## 2016-02-16 DIAGNOSIS — N189 Chronic kidney disease, unspecified: Secondary | ICD-10-CM | POA: Diagnosis not present

## 2016-02-16 DIAGNOSIS — J9601 Acute respiratory failure with hypoxia: Secondary | ICD-10-CM | POA: Diagnosis not present

## 2016-02-17 DIAGNOSIS — J449 Chronic obstructive pulmonary disease, unspecified: Secondary | ICD-10-CM

## 2016-02-17 DIAGNOSIS — E785 Hyperlipidemia, unspecified: Secondary | ICD-10-CM

## 2016-02-17 DIAGNOSIS — I10 Essential (primary) hypertension: Secondary | ICD-10-CM

## 2016-02-17 DIAGNOSIS — E039 Hypothyroidism, unspecified: Secondary | ICD-10-CM

## 2016-02-17 DIAGNOSIS — I5033 Acute on chronic diastolic (congestive) heart failure: Secondary | ICD-10-CM | POA: Diagnosis not present

## 2016-02-17 DIAGNOSIS — N184 Chronic kidney disease, stage 4 (severe): Secondary | ICD-10-CM

## 2016-02-17 DIAGNOSIS — E1142 Type 2 diabetes mellitus with diabetic polyneuropathy: Secondary | ICD-10-CM

## 2016-02-17 DIAGNOSIS — N189 Chronic kidney disease, unspecified: Secondary | ICD-10-CM

## 2016-02-17 DIAGNOSIS — J9601 Acute respiratory failure with hypoxia: Secondary | ICD-10-CM | POA: Diagnosis not present

## 2016-02-18 DIAGNOSIS — J9601 Acute respiratory failure with hypoxia: Secondary | ICD-10-CM | POA: Diagnosis not present

## 2016-02-18 DIAGNOSIS — J449 Chronic obstructive pulmonary disease, unspecified: Secondary | ICD-10-CM | POA: Diagnosis not present

## 2016-02-18 DIAGNOSIS — N184 Chronic kidney disease, stage 4 (severe): Secondary | ICD-10-CM | POA: Diagnosis not present

## 2016-02-18 DIAGNOSIS — I5033 Acute on chronic diastolic (congestive) heart failure: Secondary | ICD-10-CM | POA: Diagnosis not present

## 2016-02-20 DEATH — deceased

## 2017-05-17 ENCOUNTER — Ambulatory Visit (INDEPENDENT_AMBULATORY_CARE_PROVIDER_SITE_OTHER): Payer: Self-pay | Admitting: Orthopedic Surgery
